# Patient Record
Sex: Female | Born: 1985 | Race: Black or African American | Hispanic: No | Marital: Single | Smoking: Never smoker
Health system: Southern US, Community
[De-identification: ages and names within clinical notes are randomized; demographics above are authoritative.]

## PROBLEM LIST (undated history)

## (undated) DIAGNOSIS — R7303 Prediabetes: Secondary | ICD-10-CM

## (undated) DIAGNOSIS — J45909 Unspecified asthma, uncomplicated: Secondary | ICD-10-CM

---

## 2010-07-24 ENCOUNTER — Emergency Department (HOSPITAL_COMMUNITY)
Admission: EM | Admit: 2010-07-24 | Discharge: 2010-07-24 | Disposition: A | Discharge status: Home / Self Care | Payer: Self-pay | Attending: Emergency Medicine | Admitting: Emergency Medicine

## 2010-07-24 DIAGNOSIS — B9689 Other specified bacterial agents as the cause of diseases classified elsewhere: Secondary | ICD-10-CM | POA: Insufficient documentation

## 2010-07-24 DIAGNOSIS — N760 Acute vaginitis: Secondary | ICD-10-CM | POA: Insufficient documentation

## 2010-07-24 DIAGNOSIS — R339 Retention of urine, unspecified: Secondary | ICD-10-CM | POA: Insufficient documentation

## 2010-07-24 DIAGNOSIS — A499 Bacterial infection, unspecified: Secondary | ICD-10-CM | POA: Insufficient documentation

## 2010-07-24 DIAGNOSIS — R109 Unspecified abdominal pain: Secondary | ICD-10-CM | POA: Insufficient documentation

## 2010-07-24 DIAGNOSIS — R42 Dizziness and giddiness: Secondary | ICD-10-CM | POA: Insufficient documentation

## 2010-07-24 LAB — URINALYSIS, ROUTINE W REFLEX MICROSCOPIC
Bilirubin Urine: NEGATIVE
Nitrite: NEGATIVE
Protein, ur: NEGATIVE mg/dL
Specific Gravity, Urine: 1.02 (ref 1.005–1.030)
Urobilinogen, UA: 0.2 mg/dL (ref 0.0–1.0)

## 2010-07-24 LAB — WET PREP, GENITAL: Yeast Wet Prep HPF POC: NONE SEEN

## 2010-07-24 LAB — PREGNANCY, URINE: Preg Test, Ur: NEGATIVE

## 2010-07-24 LAB — RPR: RPR Ser Ql: NONREACTIVE

## 2010-07-25 LAB — GC/CHLAMYDIA PROBE AMP, GENITAL: Chlamydia, DNA Probe: NEGATIVE

## 2014-04-22 ENCOUNTER — Inpatient Hospital Stay (HOSPITAL_COMMUNITY)
Admission: EM | Admit: 2014-04-22 | Discharge: 2014-04-24 | DRG: 202 | Disposition: A | Discharge status: Home / Self Care | Payer: Self-pay | Attending: Family Medicine | Admitting: Family Medicine

## 2014-04-22 ENCOUNTER — Encounter (HOSPITAL_COMMUNITY): Payer: Self-pay

## 2014-04-22 ENCOUNTER — Emergency Department (HOSPITAL_COMMUNITY): Payer: Self-pay

## 2014-04-22 DIAGNOSIS — J45902 Unspecified asthma with status asthmaticus: Principal | ICD-10-CM | POA: Diagnosis present

## 2014-04-22 DIAGNOSIS — J45909 Unspecified asthma, uncomplicated: Secondary | ICD-10-CM

## 2014-04-22 DIAGNOSIS — J45901 Unspecified asthma with (acute) exacerbation: Secondary | ICD-10-CM

## 2014-04-22 DIAGNOSIS — J9600 Acute respiratory failure, unspecified whether with hypoxia or hypercapnia: Secondary | ICD-10-CM | POA: Diagnosis present

## 2014-04-22 DIAGNOSIS — J9601 Acute respiratory failure with hypoxia: Secondary | ICD-10-CM

## 2014-04-22 DIAGNOSIS — J019 Acute sinusitis, unspecified: Secondary | ICD-10-CM

## 2014-04-22 DIAGNOSIS — D509 Iron deficiency anemia, unspecified: Secondary | ICD-10-CM

## 2014-04-22 DIAGNOSIS — R0603 Acute respiratory distress: Secondary | ICD-10-CM

## 2014-04-22 DIAGNOSIS — R000 Tachycardia, unspecified: Secondary | ICD-10-CM | POA: Diagnosis present

## 2014-04-22 HISTORY — DX: Unspecified asthma, uncomplicated: J45.909

## 2014-04-22 LAB — BLOOD GAS, ARTERIAL
ACID-BASE DEFICIT: 3.3 mmol/L — AB (ref 0.0–2.0)
Bicarbonate: 21 mEq/L (ref 20.0–24.0)
Drawn by: 234301
O2 CONTENT: 2 L/min
O2 Saturation: 93.3 %
PATIENT TEMPERATURE: 37
PCO2 ART: 36.8 mmHg (ref 35.0–45.0)
TCO2: 19.6 mmol/L (ref 0–100)
pH, Arterial: 7.376 (ref 7.350–7.450)
pO2, Arterial: 73.6 mmHg — ABNORMAL LOW (ref 80.0–100.0)

## 2014-04-22 LAB — CBC WITH DIFFERENTIAL/PLATELET
BASOS PCT: 0 % (ref 0–1)
Basophils Absolute: 0.1 10*3/uL (ref 0.0–0.1)
EOS ABS: 1.5 10*3/uL — AB (ref 0.0–0.7)
Eosinophils Relative: 10 % — ABNORMAL HIGH (ref 0–5)
HCT: 34.8 % — ABNORMAL LOW (ref 36.0–46.0)
HEMOGLOBIN: 10.5 g/dL — AB (ref 12.0–15.0)
LYMPHS ABS: 2.1 10*3/uL (ref 0.7–4.0)
LYMPHS PCT: 14 % (ref 12–46)
MCH: 22.6 pg — AB (ref 26.0–34.0)
MCHC: 30.2 g/dL (ref 30.0–36.0)
MCV: 74.8 fL — AB (ref 78.0–100.0)
MONOS PCT: 4 % (ref 3–12)
Monocytes Absolute: 0.7 10*3/uL (ref 0.1–1.0)
NEUTROS ABS: 10.9 10*3/uL — AB (ref 1.7–7.7)
Neutrophils Relative %: 72 % (ref 43–77)
PLATELETS: 352 10*3/uL (ref 150–400)
RBC: 4.65 MIL/uL (ref 3.87–5.11)
RDW: 16.3 % — ABNORMAL HIGH (ref 11.5–15.5)
WBC: 15.2 10*3/uL — ABNORMAL HIGH (ref 4.0–10.5)

## 2014-04-22 LAB — BASIC METABOLIC PANEL
Anion gap: 5 (ref 5–15)
BUN: 11 mg/dL (ref 6–23)
CALCIUM: 8.8 mg/dL (ref 8.4–10.5)
CO2: 24 mmol/L (ref 19–32)
CREATININE: 0.69 mg/dL (ref 0.50–1.10)
Chloride: 107 mmol/L (ref 96–112)
GFR calc non Af Amer: >90 mL/min (ref >90)
Glucose, Bld: 152 mg/dL — ABNORMAL HIGH (ref 70–99)
POTASSIUM: 3.9 mmol/L (ref 3.5–5.1)
Sodium: 136 mmol/L (ref 135–145)

## 2014-04-22 LAB — MRSA PCR SCREENING: MRSA by PCR: NEGATIVE

## 2014-04-22 MED ORDER — INFLUENZA VAC SPLIT QUAD 0.5 ML IM SUSY
0.5000 mL | PREFILLED_SYRINGE | INTRAMUSCULAR | Status: AC
  Administered 2014-04-23: 0.5 mL via INTRAMUSCULAR
  Filled 2014-04-22: qty 0.5

## 2014-04-22 MED ORDER — IPRATROPIUM BROMIDE 0.02 % IN SOLN: 0.5000 mg | RESPIRATORY_TRACT | Status: DC

## 2014-04-22 MED ORDER — IPRATROPIUM BROMIDE 0.02 % IN SOLN
1.0000 mg | Freq: Once | RESPIRATORY_TRACT | Status: AC
Start: 2014-04-22 — End: 2014-04-22
  Administered 2014-04-22: 1 mg via RESPIRATORY_TRACT

## 2014-04-22 MED ORDER — ALBUTEROL SULFATE (2.5 MG/3ML) 0.083% IN NEBU: 2.5000 mg | INHALATION_SOLUTION | RESPIRATORY_TRACT | Status: DC | PRN

## 2014-04-22 MED ORDER — METHYLPREDNISOLONE SODIUM SUCC 125 MG IJ SOLR
60.0000 mg | Freq: Four times a day (QID) | INTRAMUSCULAR | Status: DC
  Administered 2014-04-22 – 2014-04-24 (×8): 60 mg via INTRAVENOUS
  Filled 2014-04-22 (×8): qty 2

## 2014-04-22 MED ORDER — ACETAMINOPHEN 325 MG PO TABS
650.0000 mg | ORAL_TABLET | Freq: Four times a day (QID) | ORAL | Status: DC | PRN
  Administered 2014-04-22: 650 mg via ORAL
  Filled 2014-04-22: qty 2

## 2014-04-22 MED ORDER — ENOXAPARIN SODIUM 40 MG/0.4ML ~~LOC~~ SOLN
40.0000 mg | Freq: Every day | SUBCUTANEOUS | Status: DC
  Administered 2014-04-22 – 2014-04-24 (×3): 40 mg via SUBCUTANEOUS
  Filled 2014-04-22 (×3): qty 0.4

## 2014-04-22 MED ORDER — ALBUTEROL SULFATE (2.5 MG/3ML) 0.083% IN NEBU: 2.5000 mg | INHALATION_SOLUTION | RESPIRATORY_TRACT | Status: DC

## 2014-04-22 MED ORDER — ALBUTEROL (5 MG/ML) CONTINUOUS INHALATION SOLN
10.0000 mg/h | INHALATION_SOLUTION | Freq: Once | RESPIRATORY_TRACT | Status: AC
  Administered 2014-04-22: 10 mg/h via RESPIRATORY_TRACT
  Filled 2014-04-22: qty 20

## 2014-04-22 MED ORDER — ONDANSETRON HCL 4 MG/2ML IJ SOLN: 4.0000 mg | Freq: Four times a day (QID) | INTRAMUSCULAR | Status: DC | PRN

## 2014-04-22 MED ORDER — SODIUM CHLORIDE 0.9 % IV SOLN
INTRAVENOUS | Status: DC
  Administered 2014-04-22 – 2014-04-24 (×3): via INTRAVENOUS

## 2014-04-22 MED ORDER — ALBUTEROL (5 MG/ML) CONTINUOUS INHALATION SOLN
10.0000 mg/h | INHALATION_SOLUTION | RESPIRATORY_TRACT | Status: AC
  Administered 2014-04-22: 10 mg/h via RESPIRATORY_TRACT

## 2014-04-22 MED ORDER — ALBUTEROL (5 MG/ML) CONTINUOUS INHALATION SOLN: 10.0000 mg/h | INHALATION_SOLUTION | RESPIRATORY_TRACT | Status: AC

## 2014-04-22 MED ORDER — IPRATROPIUM BROMIDE 0.02 % IN SOLN
0.5000 mg | Freq: Once | RESPIRATORY_TRACT | Status: DC
  Filled 2014-04-22: qty 2.5

## 2014-04-22 MED ORDER — ONDANSETRON HCL 4 MG PO TABS: 4.0000 mg | ORAL_TABLET | Freq: Four times a day (QID) | ORAL | Status: DC | PRN

## 2014-04-22 MED ORDER — GUAIFENESIN ER 600 MG PO TB12
600.0000 mg | ORAL_TABLET | Freq: Two times a day (BID) | ORAL | Status: DC
  Administered 2014-04-22 – 2014-04-24 (×5): 600 mg via ORAL
  Filled 2014-04-22 (×5): qty 1

## 2014-04-22 MED ORDER — IPRATROPIUM-ALBUTEROL 0.5-2.5 (3) MG/3ML IN SOLN
3.0000 mL | RESPIRATORY_TRACT | Status: DC
  Administered 2014-04-22 – 2014-04-24 (×13): 3 mL via RESPIRATORY_TRACT
  Filled 2014-04-22 (×13): qty 3

## 2014-04-22 MED ORDER — CETYLPYRIDINIUM CHLORIDE 0.05 % MT LIQD
7.0000 mL | Freq: Two times a day (BID) | OROMUCOSAL | Status: DC
  Administered 2014-04-22 – 2014-04-23 (×3): 7 mL via OROMUCOSAL

## 2014-04-22 MED ORDER — MAGNESIUM SULFATE 2 GM/50ML IV SOLN
2.0000 g | INTRAVENOUS | Status: AC
  Administered 2014-04-22: 2 g via INTRAVENOUS
  Filled 2014-04-22: qty 50

## 2014-04-22 MED ORDER — ACETAMINOPHEN 650 MG RE SUPP
650.0000 mg | Freq: Four times a day (QID) | RECTAL | Status: DC | PRN
Start: 2014-04-22 — End: 2014-04-24

## 2014-04-22 MED ORDER — PREDNISONE 20 MG PO TABS
40.0000 mg | ORAL_TABLET | Freq: Once | ORAL | Status: AC
  Administered 2014-04-22: 40 mg via ORAL
  Filled 2014-04-22: qty 2

## 2014-04-22 NOTE — ED Provider Notes (Signed)
CSN: [id number]     Arrival date & time 04/22/14  1191 History  This chart was scribed for Vida Roller, MD by Ronney Lion, ED Scribe. This patient was seen in room APA18/APA18 and the patient's care was started at 8:08 AM.    Chief Complaint  Patient presents with  . Shortness of Breath   LEVEL 5 CAVEAT DUE TO ACUITY OF CONDITION  The history is provided by the patient. No language interpreter was used.     HPI Comments: Lorraine White is a 29 y.o. female with a history of asthma who presents to the Emergency Department complaining of worsening SOB that began a few days ago, and acutely became severe at 3 AM when Lorraine White was sleeping, about 5 hours ago. Lorraine White states Lorraine White took a home nebulizer treatment last night, which provided no relief, and was followed by a migraine and fever. Lorraine White endorses "sinus trouble" before last night.  The symptoms are severe and persistent, Lorraine White is unable to talk in more than 2 word sentences due to severe respiratory distress  Past Medical History  Diagnosis Date  . Asthma    History reviewed. No pertinent past surgical history. No family history on file. History  Substance Use Topics  . Smoking status: Never Smoker   . Smokeless tobacco: Not on file  . Alcohol Use: No   OB History    No data available     Review of Systems  Unable to perform ROS: Acuity of condition  Constitutional: Positive for fever.  HENT: Positive for congestion and sinus pressure.   Respiratory: Positive for shortness of breath.   Neurological: Positive for headaches.      Allergies  Review of patient's allergies indicates no known allergies.  Home Medications   Prior to Admission medications   Medication Sig Start Date End Date Taking? Authorizing Provider  acetaminophen (TYLENOL) 650 MG CR tablet Take 650 mg by mouth every 8 (eight) hours as needed for fever.   Yes Historical Provider, MD  albuterol (PROVENTIL HFA;VENTOLIN HFA) 108 (90 BASE) MCG/ACT inhaler Inhale 2 puffs  into the lungs every 6 (six) hours as needed for wheezing or shortness of breath.   Yes Historical Provider, MD  ipratropium-albuterol (DUONEB) 0.5-2.5 (3) MG/3ML SOLN Take 3 mLs by nebulization every 6 (six) hours as needed (shortness of breath and wheezing.).   Yes Historical Provider, MD  magnesium citrate SOLN Take 1 Bottle by mouth daily as needed for mild constipation or severe constipation.   Yes Historical Provider, MD   BP 141/103 mmHg  Pulse 130  Temp(Src) 97.7 F (36.5 C) (Oral)  Resp 30  Ht  (1.626 m)  Wt 210 lb (95.255 kg)  BMI 36.03 kg/m2  SpO2 100%  LMP 03/22/2014 Physical Exam  Constitutional: Lorraine White appears well-developed and well-nourished. Lorraine White appears distressed.  HENT:  Head: Normocephalic and atraumatic.  Mouth/Throat: Posterior oropharyngeal erythema present. No oropharyngeal exudate.  Mild pharyngeal erythema.  Eyes: Conjunctivae and EOM are normal. Pupils are equal, round, and reactive to light. Right eye exhibits no discharge. Left eye exhibits no discharge. No scleral icterus.  Neck: Normal range of motion. Neck supple. No JVD present. No thyromegaly present.  Cardiovascular: Regular rhythm, normal heart sounds and intact distal pulses.  Exam reveals no gallop and no friction rub.   No murmur heard. Tachycardic to 130.  Pulmonary/Chest: Accessory muscle usage present. Lorraine White is in respiratory distress. Lorraine White has decreased breath sounds. Lorraine White has wheezes. Lorraine White has no rales.  Severe  respiratory distress. Speaks in two words sentences.  Diffuse expiratory wheezing and decreased breath sounds bilaterally with a prolonged expiratory phase, and accessory muscle use.   Abdominal: Soft. Bowel sounds are normal. Lorraine White exhibits no distension and no mass. There is no tenderness.  Musculoskeletal: Normal range of motion. Lorraine White exhibits no edema or tenderness.  Lymphadenopathy:    Lorraine White has no cervical adenopathy.  Neurological: Lorraine White is alert. Coordination normal.  Skin: Skin is  warm and dry. No rash noted. No erythema.  Psychiatric: Lorraine White has a normal mood and affect. Her behavior is normal.  Nursing note and vitals reviewed.   ED Course  Procedures (including critical care time)  DIAGNOSTIC STUDIES: Oxygen Saturation is 95% on room air, adequate by my interpretation.    COORDINATION OF CARE: 8:08 AM - Discussed treatment plan with pt at bedside which includes CXR, and monitoring, and pt agreed to plan.   Labs Review Labs Reviewed  CBC WITH DIFFERENTIAL/PLATELET - Abnormal; Notable for the following:    WBC 15.2 (*)    Hemoglobin 10.5 (*)    HCT 34.8 (*)    MCV 74.8 (*)    MCH 22.6 (*)    RDW 16.3 (*)    Neutro Abs 10.9 (*)    Eosinophils Relative 10 (*)    Eosinophils Absolute 1.5 (*)    All other components within normal limits  BASIC METABOLIC PANEL - Abnormal; Notable for the following:    Glucose, Bld 152 (*)    All other components within normal limits  BLOOD GAS, ARTERIAL    Imaging Review Dg Chest Port 1 View  04/22/2014   CLINICAL DATA:  Shortness of breath for a few days which became acutely worse at 3:00 a.m. 04/22/2014  EXAM: PORTABLE CHEST - 1 VIEW  COMPARISON:  None.  FINDINGS: Heart size and mediastinal contours are within normal limits. Both lungs are clear. Visualized skeletal structures are unremarkable.  IMPRESSION: Negative exam.   Electronically Signed   By: Drusilla Kanner M.D.   On: 04/22/2014 08:41     MDM   Final diagnoses:  Asthma  Respiratory distress  Severe asthma   The patient presents with acute respiratory distress with persistent wheezing and tachycardia. Lorraine White is hardly moving any oxygen or air at all. Lorraine White has used home nebulizer prior to arrival with minimal improvement. The patient will need a continuous nebulizer treatment with prednisone, repeat evaluation and evaluation to rule out pneumothorax pneumonia or other invasive abnormalities.  9:30 AM, reevaluated after continuous nebulizer and continues to have  persistent respiratory distress with severe wheezing. Speaking in 2 word sentences persistently. Chest x-ray reviewed without pneumothorax or pneumonia, labs show leukocytosis of 15,000, will need to be admitted for persistent respiratory distress.  Will d/w hospitalist - 2nd continuous neb being given now Magnesium IV given for severe respiratory distress.  D/w Dr. Kerry Hough who will admit to step down  Meds given in ED:  Medications  albuterol (PROVENTIL,VENTOLIN) solution continuous neb (10 mg/hr Nebulization Not Given 04/22/14 0813)  ipratropium (ATROVENT) nebulizer solution 0.5 mg (0.5 mg Nebulization Not Given 04/22/14 0814)  albuterol (PROVENTIL,VENTOLIN) solution continuous neb (not administered)  magnesium sulfate IVPB 2 g 50 mL (2 g Intravenous New Bag/Given 04/22/14 0943)  albuterol (PROVENTIL,VENTOLIN) solution continuous neb (10 mg/hr Nebulization Given 04/22/14 0814)  ipratropium (ATROVENT) nebulizer solution 1 mg (1 mg Nebulization Given 04/22/14 0814)  predniSONE (DELTASONE) tablet 40 mg (40 mg Oral Given 04/22/14 0850)    New Prescriptions   No  medications on file   CRITICAL CARE Performed by: Vida RollerMILLER,Demiya Magno D Total critical care time: 35 Critical care time was exclusive of separately billable procedures and treating other patients. Critical care was necessary to treat or prevent imminent or life-threatening deterioration. Critical care was time spent personally by me on the following activities: development of treatment plan with patient and/or surrogate as well as nursing, discussions with consultants, evaluation of patient's response to treatment, examination of patient, obtaining history from patient or surrogate, ordering and performing treatments and interventions, ordering and review of laboratory studies, ordering and review of radiographic studies, pulse oximetry and re-evaluation of patient's condition.  I personally performed the services described in this documentation, which  was scribed in my presence. The recorded information has been reviewed and is accurate.     Vida RollerBrian D Reynolds Kittel, MD 04/22/14 [phone]

## 2014-04-22 NOTE — ED Notes (Signed)
Patient with respiratory distress. H/o asthma. Wheezing noted in all lung fields. Unable to speak in full sentences. Reports taking neb last night at home, but unable to take this morning due to malfunction in machine. Patient reports using inhaler just prior to arrival without improvement.

## 2014-04-22 NOTE — H&P (Signed)
[facility] History and Physical  Lorraine StanfordBrittny Campos UJW:[id number] DOB: 08/24/1985 DOA: 04/22/2014  Referring physician: Dr. Hyacinth MeekerMiller, ER PCP: Pcp Not In System   Chief Complaint: Shortness of breath  HPI: Lorraine White is a 29 y.o. female with a history of chronic asthma, presents to the emergency room with complaints of worsening shortness of breath that began several days prior to admission. She reports that around 3 AM, she woke up with acutely worsening shortness of breath. He took a home nebulizer treatment without significant relief in her symptoms. She felt feverish at that time. She does report having sinus congestion now for last few days. She denies any sore throat, myalgias. She has a dry, nonproductive cough. She is at increased wheezing. She presents to the ER with severe and persistent shortness of breath, and was unable to speak in more than 2 word sentences. She is being admitted to the hospital for asthma exacerbation.   Review of Systems:  Pertinent positives as per HPI, otherwise negative  Past Medical History  Diagnosis Date  . Asthma    History reviewed. No pertinent past surgical history. Social History:  reports that she has never smoked. She does not have any smokeless tobacco history on file. She reports that she does not drink alcohol or use illicit drugs.  No Known Allergies  Family history: mother has asthma  Prior to Admission medications   Medication Sig Start Date End Date Taking? Authorizing Provider  acetaminophen (TYLENOL) 650 MG CR tablet Take 650 mg by mouth every 8 (eight) hours as needed for fever.   Yes Historical Provider, MD  albuterol (PROVENTIL HFA;VENTOLIN HFA) 108 (90 BASE) MCG/ACT inhaler Inhale 2 puffs into the lungs every 6 (six) hours as needed for wheezing or shortness of breath.   Yes Historical Provider, MD  ipratropium-albuterol (DUONEB) 0.5-2.5 (3) MG/3ML SOLN Take 3 mLs by nebulization every 6 (six) hours as needed (shortness of  breath and wheezing.).   Yes Historical Provider, MD  magnesium citrate SOLN Take 1 Bottle by mouth daily as needed for mild constipation or severe constipation.   Yes Historical Provider, MD   Physical Exam: Filed Vitals:   04/22/14 0900 04/22/14 1001 04/22/14 1030 04/22/14 1045  BP: 141/103  113/76   Pulse: 130  124 123  Temp:      TempSrc:      Resp:      Height:      Weight:      SpO2: 100% 98% 97% 97%    Wt Readings from Last 3 Encounters:  04/22/14 95.255 kg (210 lb)    General:  Sitting up in bed, increased work of breathing Eyes: PERRL, normal lids, irises & conjunctiva ENT: grossly normal hearing, lips & tongue Neck: no LAD, masses or thyromegaly Cardiovascular: s1, s2, tachycardic, no m/r/g. No LE edema. Telemetry: sinus tachycardia  Respiratory: bilateral wheezing with increased resp effort Abdomen: soft, ntnd Skin: no rash or induration seen on limited exam Musculoskeletal: grossly normal tone BUE/BLE Psychiatric: grossly normal mood and affect, speech fluent and appropriate Neurologic: grossly non-focal.          Labs on Admission:  Basic Metabolic Panel:  Recent Labs Lab 04/22/14 0847  NA 136  K 3.9  CL 107  CO2 24  GLUCOSE 152*  BUN 11  CREATININE 0.69  CALCIUM 8.8   Liver Function Tests: No results for input(s): AST, ALT, ALKPHOS, BILITOT, PROT, ALBUMIN in the last 168 hours. No results for input(s): LIPASE, AMYLASE in the last  168 hours. No results for input(s): AMMONIA in the last 168 hours. CBC:  Recent Labs Lab 04/22/14 0847  WBC 15.2*  NEUTROABS 10.9*  HGB 10.5*  HCT 34.8*  MCV 74.8*  PLT 352   Cardiac Enzymes: No results for input(s): CKTOTAL, CKMB, CKMBINDEX, TROPONINI in the last 168 hours.  BNP (last 3 results) No results for input(s): PROBNP in the last 8760 hours. CBG: No results for input(s): GLUCAP in the last 168 hours.  Radiological Exams on Admission: Dg Chest Port 1 View  04/22/2014   CLINICAL DATA:  Shortness  of breath for a few days which became acutely worse at 3:00 a.m. 04/22/2014  EXAM: PORTABLE CHEST - 1 VIEW  COMPARISON:  None.  FINDINGS: Heart size and mediastinal contours are within normal limits. Both lungs are clear. Visualized skeletal structures are unremarkable.  IMPRESSION: Negative exam.   Electronically Signed   By: Drusilla Kanner M.D.   On: 04/22/2014 08:41    Assessment/Plan Principal Problem:   Status asthmaticus Active Problems:   Acute respiratory failure   Tachycardia   1. Status asthmaticus. Patient will be admitted to the stepdown unit. ABG is reassuring. We'll continue with bronchodilators and intravenous steroids. Patient received magnesium in the emergency room. We'll continue to monitor. 2. Acute respiratory failure. Related to #1. Wean off oxygen as tolerated. 3. Tachycardia. Reactive to underlying pulmonary issues.    Code Status: full code DVT Prophylaxis: lovenox Family Communication: discussed with patient Disposition Plan: discharge home once improved  Time spent:  [facility] Pager [phone]

## 2014-04-22 NOTE — ED Notes (Signed)
RT at bedside.

## 2014-04-22 NOTE — ED Notes (Addendum)
Report given to Michael, RN.

## 2014-04-22 NOTE — ED Notes (Signed)
Complain of difficulty breathing. States is worse today

## 2014-04-23 LAB — BASIC METABOLIC PANEL
Anion gap: 8 (ref 5–15)
BUN: 10 mg/dL (ref 6–23)
CO2: 19 mmol/L (ref 19–32)
CREATININE: 0.63 mg/dL (ref 0.50–1.10)
Calcium: 9.1 mg/dL (ref 8.4–10.5)
Chloride: 111 mmol/L (ref 96–112)
GFR calc Af Amer: >90 mL/min (ref >90)
GFR calc non Af Amer: >90 mL/min (ref >90)
Glucose, Bld: 169 mg/dL — ABNORMAL HIGH (ref 70–99)
Potassium: 4.1 mmol/L (ref 3.5–5.1)
Sodium: 138 mmol/L (ref 135–145)

## 2014-04-23 LAB — CBC
HCT: 31.9 % — ABNORMAL LOW (ref 36.0–46.0)
Hemoglobin: 9.5 g/dL — ABNORMAL LOW (ref 12.0–15.0)
MCH: 22.1 pg — AB (ref 26.0–34.0)
MCHC: 29.8 g/dL — ABNORMAL LOW (ref 30.0–36.0)
MCV: 74.4 fL — AB (ref 78.0–100.0)
Platelets: 373 10*3/uL (ref 150–400)
RBC: 4.29 MIL/uL (ref 3.87–5.11)
RDW: 16.5 % — ABNORMAL HIGH (ref 11.5–15.5)
WBC: 17.9 10*3/uL — ABNORMAL HIGH (ref 4.0–10.5)

## 2014-04-23 MED ORDER — MONTELUKAST SODIUM 10 MG PO TABS
10.0000 mg | ORAL_TABLET | Freq: Every day | ORAL | Status: DC
  Administered 2014-04-23: 10 mg via ORAL
  Filled 2014-04-23: qty 1

## 2014-04-23 MED ORDER — POLYVINYL ALCOHOL 1.4 % OP SOLN
1.0000 [drp] | OPHTHALMIC | Status: DC | PRN
  Administered 2014-04-23 – 2014-04-24 (×2): 1 [drp] via OPHTHALMIC
  Filled 2014-04-23: qty 15

## 2014-04-23 MED ORDER — MOMETASONE FURO-FORMOTEROL FUM 100-5 MCG/ACT IN AERO
2.0000 | INHALATION_SPRAY | Freq: Two times a day (BID) | RESPIRATORY_TRACT | Status: DC
  Filled 2014-04-23: qty 8.8

## 2014-04-23 NOTE — Progress Notes (Signed)
Report given to 300 Dept. And patient transported via wheelchair to room.

## 2014-04-23 NOTE — Care Management Note (Addendum)
    Page 1 of 1   04/24/2014     2:27:23 PM CARE MANAGEMENT NOTE 04/24/2014  Patient:  Lorraine White,Lorraine White   Account Number:  [id number]  Date Initiated:  04/23/2014  Documentation initiated by:  Kathyrn SheriffHILDRESS,JESSICA  Subjective/Objective Assessment:   Pt is from home, admitted with status asthmaticus. [facility] and they assist pt with medications. Pt plans to discharge home with self care. No CM needs identified at this time.     Action/Plan:   Anticipated DC Date:  04/23/2014   Anticipated DC Plan:  HOME/SELF CARE      DC Planning Services  CM consult      Choice offered to / List presented to:             Status of service:  Completed, signed off Medicare Important Message given?   (If response is "NO", the following Medicare IM given date fields will be blank) Date Medicare IM given:   Medicare IM given by:   Date Additional Medicare IM given:   Additional Medicare IM given by:    Discharge Disposition:  HOME/SELF CARE  Per UR Regulation:    If discussed at Long Length of Stay Meetings, dates discussed:    Comments:  04/24/2014 1400 Kathyrn SheriffJessica Childress, RN, MSN, CM Pt plans to discharge home with self care possibly today. Follow up appointment made with [facility]. Pt says she will not need med assistance. If any of the medications are not on the $4 list she can fill them through the [facility] clinic with cost assistance. Referral made to the financial counselor. Pt told that fin. counselor would contact her by phone if she was not able to see her in the hospital. No further CM needs identified at this time. 04/23/2014 1300 Kathyrn SheriffJessica Childress, RN, MSN, CM

## 2014-04-23 NOTE — Care Management Utilization Note (Signed)
UR completed 

## 2014-04-23 NOTE — Progress Notes (Signed)
[facility] PROGRESS NOTE  Wynonia Neyhart WUJ:[id number] DOB: Jan 02, 1986 DOA: 04/22/2014 PCP: Pcp Not In System  Assessment/Plan: 1. Acute respiratory failure. Related to asthma exacerbation. Appears to be clinically improving. Continue current treatments. 2. Status asthmaticus. Improved with bronchodilators and intravenous steroids. The patient is improved since admission. Upon ambulation today, she became very short of breath, tachycardic and needed a wheelchair to get back to her room. We will need to continue with current treatments for now. Start her on Singulair. She reports being on Advair in the past with good results. Since she is using her rescue inhaler/albuterol on a daily basis, we will resume an inhaled steroid/long-acting beta agonist. She will need transition to prednisone, this can likely be done tomorrow. Will continue current treatments for now 3. Tachycardia. Likely reactive  Code Status: full code Family Communication: discussed with patient Disposition Plan: discharge home once improved   Consultants:    Procedures:    Antibiotics:    HPI/Subjective: Feeling better today. Still mildly short of breath, but definitely improved since admission  Objective: Filed Vitals:   04/23/14 1600  BP:   Pulse:   Temp:   Resp: 24    Intake/Output Summary (Last 24 hours) at 04/23/14 1756 Last data filed at 04/23/14 1600  Gross per 24 hour  Intake   2370 ml  Output      0 ml  Net   2370 ml   Filed Weights   04/22/14 0755 04/23/14 0500  Weight: 95.255 kg (210 lb) 92.6 kg (204 lb 2.3 oz)    Exam:   General:  NAD  Cardiovascular: S1, S2 tachycardic  Respiratory: clear bilaterally with minimal wheezing  Abdomen: soft, nt, nd, bs  MSK: no edema b/l   Data Reviewed: Basic Metabolic Panel:  Recent Labs Lab 04/22/14 0847 04/23/14 0505  NA 136 138  K 3.9 4.1  CL 107 111  CO2 24 19  GLUCOSE 152* 169*  BUN 11 10  CREATININE 0.69 0.63  CALCIUM  8.8 9.1   Liver Function Tests: No results for input(s): AST, ALT, ALKPHOS, BILITOT, PROT, ALBUMIN in the last 168 hours. No results for input(s): LIPASE, AMYLASE in the last 168 hours. No results for input(s): AMMONIA in the last 168 hours. CBC:  Recent Labs Lab 04/22/14 0847 04/23/14 0505  WBC 15.2* 17.9*  NEUTROABS 10.9*  --   HGB 10.5* 9.5*  HCT 34.8* 31.9*  MCV 74.8* 74.4*  PLT 352 373   Cardiac Enzymes: No results for input(s): CKTOTAL, CKMB, CKMBINDEX, TROPONINI in the last 168 hours. BNP (last 3 results) No results for input(s): BNP in the last 8760 hours.  ProBNP (last 3 results) No results for input(s): PROBNP in the last 8760 hours.  CBG: No results for input(s): GLUCAP in the last 168 hours.  Recent Results (from the past 240 hour(s))  MRSA PCR Screening     Status: None   Collection Time: 04/22/14 11:30 AM  Result Value Ref Range Status   MRSA by PCR NEGATIVE NEGATIVE Final    Comment:        The GeneXpert MRSA Assay (FDA approved for NASAL specimens only), is one component of a comprehensive MRSA colonization surveillance program. It is not intended to diagnose MRSA infection nor to guide or monitor treatment for MRSA infections.      Studies: Dg Chest Port 1 View  04/22/2014   CLINICAL DATA:  Shortness of breath for a few days which became acutely worse at 3:00 a.m. 04/22/2014  EXAM: PORTABLE CHEST - 1 VIEW  COMPARISON:  None.  FINDINGS: Heart size and mediastinal contours are within normal limits. Both lungs are clear. Visualized skeletal structures are unremarkable.  IMPRESSION: Negative exam.   Electronically Signed   By: Drusilla Kannerhomas  Dalessio M.D.   On: 04/22/2014 08:41    Scheduled Meds: . antiseptic oral rinse  7 mL Mouth Rinse BID  . enoxaparin (LOVENOX) injection  40 mg Subcutaneous Daily  . guaiFENesin  600 mg Oral BID  . ipratropium-albuterol  3 mL Nebulization Q4H  . methylPREDNISolone (SOLU-MEDROL) injection  60 mg Intravenous Q6H    Continuous Infusions: . sodium chloride 75 mL/hr at 04/23/14 1400    Principal Problem:   Status asthmaticus Active Problems:   Acute respiratory failure   Tachycardia    Time spent: 30mins    MEMON,JEHANZEB  [facility] Pager [phone]. If 7PM-7AM, please contact night-coverage at [email or web address], password [email or web address] 04/23/2014, 5:56 PM  LOS: 1 day

## 2014-04-24 DIAGNOSIS — J9601 Acute respiratory failure with hypoxia: Secondary | ICD-10-CM

## 2014-04-24 DIAGNOSIS — J0100 Acute maxillary sinusitis, unspecified: Secondary | ICD-10-CM

## 2014-04-24 DIAGNOSIS — J45901 Unspecified asthma with (acute) exacerbation: Secondary | ICD-10-CM

## 2014-04-24 DIAGNOSIS — J019 Acute sinusitis, unspecified: Secondary | ICD-10-CM

## 2014-04-24 DIAGNOSIS — D509 Iron deficiency anemia, unspecified: Secondary | ICD-10-CM

## 2014-04-24 LAB — BASIC METABOLIC PANEL
ANION GAP: 10 (ref 5–15)
BUN: 16 mg/dL (ref 6–23)
CO2: 21 mmol/L (ref 19–32)
CREATININE: 0.68 mg/dL (ref 0.50–1.10)
Calcium: 8.8 mg/dL (ref 8.4–10.5)
Chloride: 106 mmol/L (ref 96–112)
GFR calc Af Amer: >90 mL/min (ref >90)
GFR calc non Af Amer: >90 mL/min (ref >90)
Glucose, Bld: 164 mg/dL — ABNORMAL HIGH (ref 70–99)
Potassium: 4.1 mmol/L (ref 3.5–5.1)
Sodium: 137 mmol/L (ref 135–145)

## 2014-04-24 LAB — CBC
HCT: 30.1 % — ABNORMAL LOW (ref 36.0–46.0)
Hemoglobin: 9.1 g/dL — ABNORMAL LOW (ref 12.0–15.0)
MCH: 22.4 pg — AB (ref 26.0–34.0)
MCHC: 30.2 g/dL (ref 30.0–36.0)
MCV: 74 fL — ABNORMAL LOW (ref 78.0–100.0)
Platelets: 381 10*3/uL (ref 150–400)
RBC: 4.07 MIL/uL (ref 3.87–5.11)
RDW: 17.1 % — AB (ref 11.5–15.5)
WBC: 22.9 10*3/uL — AB (ref 4.0–10.5)

## 2014-04-24 LAB — PREGNANCY, URINE: PREG TEST UR: NEGATIVE

## 2014-04-24 MED ORDER — FLUTICASONE PROPIONATE HFA 44 MCG/ACT IN AERO: 2.0000 | INHALATION_SPRAY | Freq: Two times a day (BID) | RESPIRATORY_TRACT | Status: DC

## 2014-04-24 MED ORDER — FLUTICASONE PROPIONATE HFA 44 MCG/ACT IN AERO
2.0000 | INHALATION_SPRAY | Freq: Two times a day (BID) | RESPIRATORY_TRACT | Status: DC
  Filled 2014-04-24: qty 10.6

## 2014-04-24 MED ORDER — PREDNISONE 20 MG PO TABS: 40.0000 mg | ORAL_TABLET | Freq: Every day | ORAL | Status: DC

## 2014-04-24 MED ORDER — AMOXICILLIN-POT CLAVULANATE 875-125 MG PO TABS
1.0000 | ORAL_TABLET | Freq: Two times a day (BID) | ORAL | Status: DC
  Administered 2014-04-24: 1 via ORAL
  Filled 2014-04-24: qty 1

## 2014-04-24 MED ORDER — ALBUTEROL SULFATE (2.5 MG/3ML) 0.083% IN NEBU: 2.5000 mg | INHALATION_SOLUTION | RESPIRATORY_TRACT | Status: AC | PRN

## 2014-04-24 MED ORDER — AMOXICILLIN-POT CLAVULANATE 875-125 MG PO TABS: 1.0000 | ORAL_TABLET | Freq: Two times a day (BID) | ORAL | Status: DC

## 2014-04-24 MED ORDER — PREDNISONE 10 MG PO TABS: ORAL_TABLET | ORAL | Status: DC

## 2014-04-24 NOTE — Discharge Summary (Signed)
Physician Discharge Summary  Lorraine White WUJ:[id number] DOB: 1986-01-25 DOA: 04/22/2014  PCP: Pcp Not In System Lorraine White for PCP care Lorraine White [phone]  Admit date: 04/22/2014 Discharge date: 04/24/2014  Recommendations for Outpatient Follow-up:  1. Ongoing treatment for asthma, suspect moderate persistent. Inhaled steroid added.  2. Resolution of acute sinusitis 3. Microcytic anemia, chronic by history. Consider outpatient evaluation as clinically indicated.   Follow-up Information    Follow up with [facility] On 05/07/2014.   Why:  at 3:20 pm   Contact information:   [phone]     Discharge Diagnoses:  1. Acute hypoxic respiratory failure 2. Status asthmaticus 3. Asthma exacerbation 4. Acute sinusitis 5. Microcytic anemia  Discharge Condition: improved Disposition: home  Diet recommendation: regular  Filed Weights   04/22/14 0755 04/23/14 0500  Weight: 95.255 kg (210 lb) 92.6 kg (204 lb 2.3 oz)    History of present illness:  29 year old woman with history of chronic asthma presented with severe respiratory distress. Admitted for status asthmaticus. Treated with steroids, bronchodilators, intravenous magnesium.  Hospital Course:  Ms. Lorraine White was treated aggressively with steroids, oxygen supplementation, bronchodilators. Her symptoms gradually improved and are very close to baseline at this point. Hypoxic respiratory failure resolved. She has had symptoms for approximately 10 days and more of acute sinusitis which may have precipitated this. She does however give a history of daily rescue inhaler use over the last month suggesting moderate persistent asthma. Therefore an inhaled steroid was added to her regimen. Other issues as below.  1. Acute hypoxic respiratory failure secondary to asthma exacerbation. 2. Status asthmaticus. Resolved 3. Asthma exacerbation, history suggests moderate persistent asthma--added inhaled steroid. 4. Acute  sinusitis with 10 days of symptoms, may have precipitated acute exacerbation. 5. Microcytic anemia, chronic per patient, usually around 9. Mother with h/o of anemia as well.   Plan to treat empirically for sinusitis  Rx for home nebulizer albuterol (has machine), steroid taper, inhaled steroids  Fax information to PCP  Consultants:  none  Procedures:  none  Antibiotics: Augmentin x7 days  Discharge Instructions  Discharge Instructions    Activity as tolerated - No restrictions    Complete by:  As directed      Diet general    Complete by:  As directed      Discharge instructions    Complete by:  As directed   Call your physician or seek immediate medical attention for wheezing, shortness of breath or worsening of condition.          Current Discharge Medication List    START taking these medications   Details  albuterol (PROVENTIL) (2.5 MG/3ML) 0.083% nebulizer solution Take 3 mLs (2.5 mg total) by nebulization every 4 (four) hours as needed for wheezing. Qty: 75 mL, Refills: 0    amoxicillin-clavulanate (AUGMENTIN) 875-125 MG per tablet Take 1 tablet by mouth every 12 (twelve) hours. Qty: 13 tablet, Refills: 0    fluticasone (FLOVENT HFA) 44 MCG/ACT inhaler Inhale 2 puffs into the lungs 2 (two) times daily. Qty: 1 Inhaler, Refills: 0    predniSONE (DELTASONE) 10 MG tablet Take 40 mg by mouth daily for 3 days, then take 20 mg by mouth daily for 3 days, then take 10 mg by mouth daily for 3 days, then stop. Qty: 21 tablet, Refills: 0      CONTINUE these medications which have NOT CHANGED   Details  acetaminophen (TYLENOL) 650 MG CR tablet Take 650 mg by mouth every 8 (eight) hours as  needed for fever.    albuterol (PROVENTIL HFA;VENTOLIN HFA) 108 (90 BASE) MCG/ACT inhaler Inhale 2 puffs into the lungs every 6 (six) hours as needed for wheezing or shortness of breath.    magnesium citrate SOLN Take 1 Bottle by mouth daily as needed for mild constipation or  severe constipation.      STOP taking these medications     ipratropium-albuterol (DUONEB) 0.5-2.5 (3) MG/3ML SOLN        No Known Allergies  The results of significant diagnostics from this hospitalization (including imaging, microbiology, ancillary and laboratory) are listed below for reference.    Significant Diagnostic Studies: Dg Chest Port 1 View  04/22/2014   CLINICAL DATA:  Shortness of breath for a few days which became acutely worse at 3:00 a.m. 04/22/2014  EXAM: PORTABLE CHEST - 1 VIEW  COMPARISON:  None.  FINDINGS: Heart size and mediastinal contours are within normal limits. Both lungs are clear. Visualized skeletal structures are unremarkable.  IMPRESSION: Negative exam.   Electronically Signed   By: Lorraine Kannerhomas  White M.D.   On: 04/22/2014 08:41    Microbiology: Recent Results (from the past 240 hour(s))  MRSA PCR Screening     Status: None   Collection Time: 04/22/14 11:30 AM  Result Value Ref Range Status   MRSA by PCR NEGATIVE NEGATIVE Final    Comment:        The GeneXpert MRSA Assay (FDA approved for NASAL specimens only), is one component of a comprehensive MRSA colonization surveillance program. It is not intended to diagnose MRSA infection nor to guide or monitor treatment for MRSA infections.      Labs: Basic Metabolic Panel:  Recent Labs Lab 04/22/14 0847 04/23/14 0505 04/24/14 0558  NA 136 138 137  K 3.9 4.1 4.1  CL 107 111 106  CO2 24 19 21   GLUCOSE 152* 169* 164*  BUN 11 10 16   CREATININE 0.69 0.63 0.68  CALCIUM 8.8 9.1 8.8   CBC:  Recent Labs Lab 04/22/14 0847 04/23/14 0505 04/24/14 0558  WBC 15.2* 17.9* 22.9*  NEUTROABS 10.9*  --   --   HGB 10.5* 9.5* 9.1*  HCT 34.8* 31.9* 30.1*  MCV 74.8* 74.4* 74.0*  PLT 352 373 381    Principal Problem:   Status asthmaticus Active Problems:   Tachycardia   Acute respiratory failure with hypoxia   Asthma exacerbation   Acute sinusitis   Microcytic anemia   Time coordinating  discharge: 35 minutes  Signed:  Brendia Sacksaniel Shakeda Pearse, MD [facility] 04/24/2014, 3:26 PM

## 2014-04-24 NOTE — Progress Notes (Addendum)
  PROGRESS NOTE  Lavena StanfordBrittny Kuenzi LKG:[id number] DOB: 06/27/1985 DOA: 04/22/2014 PCP: [facility] In [facility] Mariam Dollarlizabeth Pickral [phone]  Summary: 29 year old woman with history of chronic asthma presented with severe respiratory distress. Admitted for status asthmaticus. Treated with steroids, bronchodilators, intravenous magnesium.  Assessment/Plan: 1. Acute hypoxic respiratory failure secondary to asthma exacerbation. 2. Status asthmaticus. Resolved 3. Asthma exacerbation, history suggests moderate persistent asthma--either medium dose steroid or low-dose + LABA 4. Acute sinusitis with 10 days of symptoms 5. Microcytic anemia, chronic per patient, usually around 9. Mother with h/o of anemia as well.   Much better, plan on discharge later today  Plan to treat empirically for sinusitis  Rx for home nebulizer albuterol (has machine), steroid taper, inhaled steroids  Fax information to PCP  Consider further evaluation of anemia as an outpatient.  Brendia Sacksaniel Goodrich, MD  [facility]  Pager [phone] If 7PM-7AM, please contact night-coverage at [email or web address], password [email or web address] 04/24/2014, 12:06 PM  LOS: 2 days   Consultants:    Procedures:    Antibiotics:    HPI/Subjective: Feels much better, eating well, no n/v. Ambulating well. Breathing better. Mother at bedside feels like she is doing very well. Both want her to go home. Some sinus pressure frontal and maxillary, but improving.  Objective: Filed Vitals:   04/24/14 0321 04/24/14 0618 04/24/14 0710 04/24/14 1118  BP:  117/66    Pulse:  113    Temp:  98.1 F (36.7 C)    TempSrc:  Oral    Resp:  20    Height:      Weight:      SpO2: 96% 97% 96% 94%    Intake/Output Summary (Last 24 hours) at 04/24/14 1206 Last data filed at 04/24/14 0953  Gross per 24 hour  Intake   1630 ml  Output      0 ml  Net   1630 ml     Filed Weights   04/22/14 0755 04/23/14 0500  Weight: 95.255 kg (210 lb) 92.6  kg (204 lb 2.3 oz)    Exam:     Afebrile, vital signs stable. No hypoxia. Heart rate 113. General:  Appears calm and comfortable ENT: some pain with palpation of maxillary sinuses Cardiovascular: RRR, no m/r/g. No LE edema. Respiratory: CTA bilaterally, no w/r/r. Normal respiratory effort. Speaks in full sentences.  Psychiatric: grossly normal mood and affect, speech fluent and appropriate  Data Reviewed:  Basic metabolic panel unremarkable  WBC 22.9 (on steroids)  Hemoglobin without significant change, 9.1  Pertinent data:  Labs  Hemoglobin 10.5 >> 9.5 >> 9.1. MCV 74. Imaging   CXR NAD  Scheduled Meds: . enoxaparin (LOVENOX) injection  40 mg Subcutaneous Daily  . guaiFENesin  600 mg Oral BID  . ipratropium-albuterol  3 mL Nebulization Q4H  . methylPREDNISolone (SOLU-MEDROL) injection  60 mg Intravenous Q6H  . mometasone-formoterol  2 puff Inhalation BID  . montelukast  10 mg Oral QHS   Continuous Infusions: . sodium chloride 75 mL/hr at 04/24/14 1101    Principal Problem:   Status asthmaticus Active Problems:   Tachycardia   Acute respiratory failure with hypoxia   Asthma exacerbation   Acute sinusitis   Microcytic anemia

## 2016-10-14 ENCOUNTER — Emergency Department (HOSPITAL_COMMUNITY): Payer: Self-pay

## 2016-10-14 ENCOUNTER — Encounter (HOSPITAL_COMMUNITY): Payer: Self-pay | Admitting: Emergency Medicine

## 2016-10-14 ENCOUNTER — Emergency Department (HOSPITAL_COMMUNITY)
Admission: EM | Admit: 2016-10-14 | Discharge: 2016-10-14 | Disposition: A | Discharge status: Home / Self Care | Payer: Self-pay | Attending: Emergency Medicine | Admitting: Emergency Medicine

## 2016-10-14 DIAGNOSIS — Z5321 Procedure and treatment not carried out due to patient leaving prior to being seen by health care provider: Secondary | ICD-10-CM | POA: Insufficient documentation

## 2016-10-14 DIAGNOSIS — M79602 Pain in left arm: Secondary | ICD-10-CM | POA: Insufficient documentation

## 2016-10-14 HISTORY — DX: Prediabetes: R73.03

## 2016-10-14 NOTE — ED Triage Notes (Signed)
Pt c/o severe left forearm pain since last night. Denies injury. Pt states pain is radiating into upper arm. Pt c/o left side neck pain yesterday. Arm Pain worse with movement.

## 2016-10-21 MED ORDER — SODIUM CHLORIDE 0.9 % IJ SOLN
INTRAMUSCULAR | Status: AC
  Filled 2016-10-21: qty 10

## 2017-03-06 ENCOUNTER — Other Ambulatory Visit: Payer: Self-pay

## 2017-03-06 ENCOUNTER — Encounter (HOSPITAL_COMMUNITY): Payer: Self-pay | Admitting: *Deleted

## 2017-03-06 ENCOUNTER — Observation Stay (HOSPITAL_COMMUNITY)
Admission: EM | Admit: 2017-03-06 | Discharge: 2017-03-07 | Disposition: A | Discharge status: Home / Self Care | Payer: Self-pay | Attending: Family Medicine | Admitting: Family Medicine

## 2017-03-06 ENCOUNTER — Emergency Department (HOSPITAL_COMMUNITY): Payer: Self-pay

## 2017-03-06 DIAGNOSIS — J45902 Unspecified asthma with status asthmaticus: Secondary | ICD-10-CM

## 2017-03-06 DIAGNOSIS — Z79899 Other long term (current) drug therapy: Secondary | ICD-10-CM | POA: Insufficient documentation

## 2017-03-06 DIAGNOSIS — J4521 Mild intermittent asthma with (acute) exacerbation: Secondary | ICD-10-CM

## 2017-03-06 DIAGNOSIS — J45901 Unspecified asthma with (acute) exacerbation: Principal | ICD-10-CM | POA: Diagnosis present

## 2017-03-06 LAB — CBC WITH DIFFERENTIAL/PLATELET
Basophils Absolute: 0 10*3/uL (ref 0.0–0.1)
Basophils Relative: 0 %
EOS PCT: 13 %
Eosinophils Absolute: 1.4 10*3/uL — ABNORMAL HIGH (ref 0.0–0.7)
HEMATOCRIT: 30.7 % — AB (ref 36.0–46.0)
HEMOGLOBIN: 8.9 g/dL — AB (ref 12.0–15.0)
Lymphocytes Relative: 30 %
Lymphs Abs: 3.3 10*3/uL (ref 0.7–4.0)
MCH: 20.6 pg — ABNORMAL LOW (ref 26.0–34.0)
MCHC: 29 g/dL — ABNORMAL LOW (ref 30.0–36.0)
MCV: 70.9 fL — AB (ref 78.0–100.0)
MONO ABS: 0.7 10*3/uL (ref 0.1–1.0)
MONOS PCT: 6 %
Neutro Abs: 5.7 10*3/uL (ref 1.7–7.7)
Neutrophils Relative %: 51 %
Platelets: 346 10*3/uL (ref 150–400)
RBC: 4.33 MIL/uL (ref 3.87–5.11)
RDW: 17.9 % — AB (ref 11.5–15.5)
WBC: 11.1 10*3/uL — ABNORMAL HIGH (ref 4.0–10.5)

## 2017-03-06 LAB — BASIC METABOLIC PANEL
Anion gap: 8 (ref 5–15)
BUN: 13 mg/dL (ref 6–20)
CHLORIDE: 108 mmol/L (ref 101–111)
CO2: 21 mmol/L — AB (ref 22–32)
Calcium: 8.8 mg/dL — ABNORMAL LOW (ref 8.9–10.3)
Creatinine, Ser: 0.63 mg/dL (ref 0.44–1.00)
GFR calc non Af Amer: >60 mL/min (ref >60)
Glucose, Bld: 171 mg/dL — ABNORMAL HIGH (ref 65–99)
Potassium: 3.9 mmol/L (ref 3.5–5.1)
Sodium: 137 mmol/L (ref 135–145)

## 2017-03-06 LAB — INFLUENZA PANEL BY PCR (TYPE A & B)
INFLAPCR: NEGATIVE
Influenza B By PCR: NEGATIVE

## 2017-03-06 MED ORDER — ACETAMINOPHEN 325 MG PO TABS
650.0000 mg | ORAL_TABLET | Freq: Three times a day (TID) | ORAL | Status: DC | PRN
  Filled 2017-03-06: qty 2

## 2017-03-06 MED ORDER — ALBUTEROL SULFATE (2.5 MG/3ML) 0.083% IN NEBU: 2.5000 mg | INHALATION_SOLUTION | Freq: Four times a day (QID) | RESPIRATORY_TRACT | Status: DC

## 2017-03-06 MED ORDER — SODIUM CHLORIDE 0.9 % IV BOLUS (SEPSIS)
1000.0000 mL | Freq: Once | INTRAVENOUS | Status: AC
  Administered 2017-03-06: 1000 mL via INTRAVENOUS

## 2017-03-06 MED ORDER — PREDNISONE 50 MG PO TABS
60.0000 mg | ORAL_TABLET | Freq: Once | ORAL | Status: AC
  Administered 2017-03-06: 60 mg via ORAL
  Filled 2017-03-06: qty 1

## 2017-03-06 MED ORDER — ALBUTEROL SULFATE (2.5 MG/3ML) 0.083% IN NEBU
2.5000 mg | INHALATION_SOLUTION | Freq: Once | RESPIRATORY_TRACT | Status: AC
  Administered 2017-03-06: 2.5 mg via RESPIRATORY_TRACT
  Filled 2017-03-06: qty 3

## 2017-03-06 MED ORDER — IPRATROPIUM BROMIDE 0.02 % IN SOLN
0.5000 mg | Freq: Once | RESPIRATORY_TRACT | Status: AC
  Administered 2017-03-06: 0.5 mg via RESPIRATORY_TRACT
  Filled 2017-03-06: qty 2.5

## 2017-03-06 MED ORDER — SODIUM CHLORIDE 0.9 % IV SOLN
INTRAVENOUS | Status: DC
  Administered 2017-03-06: 11:00:00 via INTRAVENOUS

## 2017-03-06 MED ORDER — METHYLPREDNISOLONE SODIUM SUCC 125 MG IJ SOLR
60.0000 mg | Freq: Four times a day (QID) | INTRAMUSCULAR | Status: DC
  Administered 2017-03-06 – 2017-03-07 (×6): 60 mg via INTRAVENOUS
  Filled 2017-03-06 (×6): qty 2

## 2017-03-06 MED ORDER — MAGNESIUM SULFATE 2 GM/50ML IV SOLN
2.0000 g | Freq: Once | INTRAVENOUS | Status: AC
  Administered 2017-03-06: 2 g via INTRAVENOUS
  Filled 2017-03-06: qty 50

## 2017-03-06 MED ORDER — ALBUTEROL (5 MG/ML) CONTINUOUS INHALATION SOLN
15.0000 mg/h | INHALATION_SOLUTION | Freq: Once | RESPIRATORY_TRACT | Status: AC
  Administered 2017-03-06: 15 mg/h via RESPIRATORY_TRACT
  Filled 2017-03-06: qty 20

## 2017-03-06 MED ORDER — GUAIFENESIN ER 600 MG PO TB12
600.0000 mg | ORAL_TABLET | Freq: Two times a day (BID) | ORAL | Status: DC
  Administered 2017-03-06 – 2017-03-07 (×3): 600 mg via ORAL
  Filled 2017-03-06 (×3): qty 1

## 2017-03-06 MED ORDER — ONDANSETRON HCL 4 MG/2ML IJ SOLN: 4.0000 mg | Freq: Four times a day (QID) | INTRAMUSCULAR | Status: DC | PRN

## 2017-03-06 MED ORDER — IPRATROPIUM-ALBUTEROL 0.5-2.5 (3) MG/3ML IN SOLN
3.0000 mL | Freq: Four times a day (QID) | RESPIRATORY_TRACT | Status: DC
  Administered 2017-03-06 – 2017-03-07 (×5): 3 mL via RESPIRATORY_TRACT
  Filled 2017-03-06 (×6): qty 3

## 2017-03-06 MED ORDER — IPRATROPIUM-ALBUTEROL 0.5-2.5 (3) MG/3ML IN SOLN
3.0000 mL | Freq: Once | RESPIRATORY_TRACT | Status: AC
  Administered 2017-03-06: 3 mL via RESPIRATORY_TRACT
  Filled 2017-03-06: qty 3

## 2017-03-06 MED ORDER — ONDANSETRON HCL 4 MG PO TABS: 4.0000 mg | ORAL_TABLET | Freq: Four times a day (QID) | ORAL | Status: DC | PRN

## 2017-03-06 MED ORDER — ALBUTEROL (5 MG/ML) CONTINUOUS INHALATION SOLN
10.0000 mg/h | INHALATION_SOLUTION | Freq: Once | RESPIRATORY_TRACT | Status: AC
  Administered 2017-03-06: 10 mg/h via RESPIRATORY_TRACT
  Filled 2017-03-06: qty 20

## 2017-03-06 MED ORDER — ENOXAPARIN SODIUM 40 MG/0.4ML ~~LOC~~ SOLN
40.0000 mg | SUBCUTANEOUS | Status: DC
  Administered 2017-03-06: 40 mg via SUBCUTANEOUS
  Filled 2017-03-06: qty 0.4

## 2017-03-06 MED ORDER — IPRATROPIUM BROMIDE 0.02 % IN SOLN: 0.5000 mg | Freq: Four times a day (QID) | RESPIRATORY_TRACT | Status: DC

## 2017-03-06 NOTE — H&P (Signed)
TRH H&P    Patient Demographics:    Lorraine White, is a [age] y.o. female  MRN: [id number]  DOB - 09/29/1985  Admit Date - 03/06/2017  Referring MD/NP/PA: Dr. Lynelle DoctorKnapp  Outpatient Primary MD for the patient is System, Provider Not In  Patient coming from: home  Chief Complaint  Patient presents with  . Shortness of Breath      HPI:    Lorraine White  is a 31 y.o. female, with history of asthma came to hospital with worsening shortness of breath for three days. Patient stated that she covered up thick white and yellow phlegm and has been wheezing. She also has difficulty talking with her voice changed due to shortness of breath. Patient takes inhaler at home but it was not helping her so she came to hospital for further evaluation. Patient was admitted to hospital a few years ago. There were no pets at home but a thick carpet.  She denies chest pain. Denies abdominal pain. Complains of two episodes of vomiting. No diarrhea. No dysuria, urgency or frequency of urination.  In the ED patient found to be an asthma exacerbation. Somewhat improved with the blood treatment.    Review of systems:     All other systems reviewed and are negative.   With Past History of the following :    Past Medical History:  Diagnosis Date  . Asthma   . Prediabetes       History reviewed. No pertinent surgical history.    Social History:      Social History   Tobacco Use  . Smoking status: Never Smoker  . Smokeless tobacco: Never Used  Substance Use Topics  . Alcohol use: Yes    Comment: occasional       Family History :   Patient's mother also has a history of asthma   Home Medications:   Prior to Admission medications   Medication Sig Start Date End Date Taking? Authorizing Provider  acetaminophen (TYLENOL) 650 MG CR tablet Take 650 mg by mouth every 8 (eight) hours as needed for fever.     [provider]  albuterol (PROVENTIL HFA;VENTOLIN HFA) 108 (90 BASE) MCG/ACT inhaler Inhale 2 puffs into the lungs every 6 (six) hours as needed for wheezing or shortness of breath.    [provider]  albuterol (PROVENTIL) (2.5 MG/3ML) 0.083% nebulizer solution Take 3 mLs (2.5 mg total) by nebulization every 4 (four) hours as needed for wheezing. 04/24/14   Standley BrookingGoodrich, Daniel P, MD  amoxicillin-clavulanate (AUGMENTIN) 875-125 MG per tablet Take 1 tablet by mouth every 12 (twelve) hours. 04/24/14   Standley BrookingGoodrich, Daniel P, MD  fluticasone (FLOVENT HFA) 44 MCG/ACT inhaler Inhale 2 puffs into the lungs 2 (two) times daily. 04/24/14   Standley BrookingGoodrich, Daniel P, MD  magnesium citrate SOLN Take 1 Bottle by mouth daily as needed for mild constipation or severe constipation.    [provider]  predniSONE (DELTASONE) 10 MG tablet Take 40 mg by mouth daily for 3 days, then take 20 mg by mouth daily for  3 days, then take 10 mg by mouth daily for 3 days, then stop. 04/24/14   Standley BrookingGoodrich, Daniel P, MD     Allergies:    No Known Allergies   Physical Exam:   Vitals  Blood pressure 135/80, pulse (!) 112, temperature 97.9 F (36.6 C), temperature source Oral, resp. rate 20, weight 90.7 kg (200 lb), SpO2 95 %.  1.  General: appears in no acute distress  2. Psychiatric:  Intact judgement and  insight, awake alert, oriented x 3.  3. Neurologic: No focal neurological deficits, all cranial nerves intact.Strength 5/5 all 4 extremities, sensation intact all 4 extremities, plantars down going.  4. Eyes :  anicteric sclerae, moist conjunctivae with no lid lag. PERRLA.  5. ENMT:  Oropharynx clear with moist mucous membranes and good dentition  6. Neck:  supple, no cervical lymphadenopathy appriciated, No thyromegaly  7. Respiratory : Normal respiratory effort, bilateral wheezing auscultated  8. Cardiovascular : RRR, no gallops, rubs or murmurs, no leg edema  9. Gastrointestinal:  Positive  bowel sounds, abdomen soft, non-tender to palpation,no hepatosplenomegaly, no rigidity or guarding       10. Skin:  No cyanosis, normal texture and turgor, no rash, lesions or ulcers  11.Musculoskeletal:  Good muscle tone,  joints appear normal , no effusions,  normal range of motion    Data Review:    CBC Recent Labs  Lab 03/06/17 0320  WBC 11.1*  HGB 8.9*  HCT 30.7*  PLT 346  MCV 70.9*  MCH 20.6*  MCHC 29.0*  RDW 17.9*  LYMPHSABS 3.3  MONOABS 0.7  EOSABS 1.4*  BASOSABS 0.0   ------------------------------------------------------------------------------------------------------------------  Chemistries  Recent Labs  Lab 03/06/17 0320  NA 137  K 3.9  CL 108  CO2 21*  GLUCOSE 171*  BUN 13  CREATININE 0.63  CALCIUM 8.8*   ------------------------------------------------------------------------------------------------------------------  ------------------------------------------------------------------------------------------------------------------  --------------------------------------------------------------------------------------------------------------- Urine analysis:    Component Value Date/Time   COLORURINE YELLOW 07/24/2010 0640   APPEARANCEUR CLEAR 07/24/2010 0640   LABSPEC 1.020 07/24/2010 0640   PHURINE 7.0 07/24/2010 0640   GLUCOSEU NEGATIVE 07/24/2010 0640   HGBUR NEGATIVE 07/24/2010 0640   BILIRUBINUR NEGATIVE 07/24/2010 0640   KETONESUR NEGATIVE 07/24/2010 0640   PROTEINUR NEGATIVE 07/24/2010 0640   UROBILINOGEN 0.2 07/24/2010 0640   NITRITE NEGATIVE 07/24/2010 0640   LEUKOCYTESUR  07/24/2010 0640    NEGATIVE MICROSCOPIC NOT DONE ON URINES WITH NEGATIVE PROTEIN, BLOOD, LEUKOCYTES, NITRITE, OR GLUCOSE <1000 mg/dL.      Imaging Results:      Assessment & Plan:    Active Problems:   Asthma exacerbation   1. Asthma exacerbation-placed under observation, Solu-Medrol 60 mg IV to six hours, duo neb nebulizers Q6 hours, Mucinex one   PO BID.    DVT Prophylaxis-   Lovenox   AM Labs Ordered, also please review Full Orders  Family Communication: Admission, patients condition and plan of care including tests being ordered have been discussed with the patient and her mother's bedside* who indicate understanding and agree with the plan and Code Status.  Code Status: full code  Admission status: observation  Time spent in minutes : 50 minutes   Meredeth IdeGagan S Tykeisha Peer M.D on 03/06/2017 at 6:11 AM  Between 7am to 7pm - Pager - [phone]. After 7pm go to [email or web address] - password [email or web address]  [facility] - Office  [phone]

## 2017-03-06 NOTE — ED Provider Notes (Signed)
Lorraine White Provider Note   CSN: [id number] Arrival date & time: 03/06/17  0119  Time seen 01:50 AM   History   Chief Complaint Chief Complaint  Patient presents with  . Shortness of Breath   Level 5 caveat for respiratory distress  HPI Lorraine White is a 31 y.o. female.  HPI   my patient states she started having difficulty breathing about 3 days ago.  She has had some cough with some yellow white sputum production but states the main problem has been feeling short of breath and wheezing.  She had a leftover inhaler and nebulizers from several years ago that she has been using and they did help until today.  She also had some leftover Singulair that she is been taking this month and this is also left over from several years ago.  She denies any fever.  She is states the last time she was admitted was a few years ago, but the last time she was on steroids was a few years ago.  She is not sure of any precipitating events but she had been cleaning and it was very dusty.  PCP none  Past Medical History:  Diagnosis Date  . Asthma   . Prediabetes     Patient Active Problem List   Diagnosis Date Noted  . Acute respiratory failure with hypoxia (HCC) 04/24/2014  . Asthma exacerbation 04/24/2014  . Acute sinusitis 04/24/2014  . Microcytic anemia 04/24/2014  . Status asthmaticus 04/22/2014  . Tachycardia 04/22/2014    History reviewed. No pertinent surgical history.  OB History    No data available       Home Medications    Prior to Admission medications   Medication Sig Start Date End Date Taking? Authorizing Provider  acetaminophen (TYLENOL) 650 MG CR tablet Take 650 mg by mouth every 8 (eight) hours as needed for fever.    [provider]  albuterol (PROVENTIL HFA;VENTOLIN HFA) 108 (90 BASE) MCG/ACT inhaler Inhale 2 puffs into the lungs every 6 (six) hours as needed for wheezing or shortness of breath.    [provider]    albuterol (PROVENTIL) (2.5 MG/3ML) 0.083% nebulizer solution Take 3 mLs (2.5 mg total) by nebulization every 4 (four) hours as needed for wheezing. 04/24/14   Standley BrookingGoodrich, Daniel P, MD  amoxicillin-clavulanate (AUGMENTIN) 875-125 MG per tablet Take 1 tablet by mouth every 12 (twelve) hours. 04/24/14   Standley BrookingGoodrich, Daniel P, MD  fluticasone (FLOVENT HFA) 44 MCG/ACT inhaler Inhale 2 puffs into the lungs 2 (two) times daily. 04/24/14   Standley BrookingGoodrich, Daniel P, MD  magnesium citrate SOLN Take 1 Bottle by mouth daily as needed for mild constipation or severe constipation.    [provider]  predniSONE (DELTASONE) 10 MG tablet Take 40 mg by mouth daily for 3 days, then take 20 mg by mouth daily for 3 days, then take 10 mg by mouth daily for 3 days, then stop. 04/24/14   Standley BrookingGoodrich, Daniel P, MD    Family History No family history on file.  Social History Social History   Tobacco Use  . Smoking status: Never Smoker  . Smokeless tobacco: Never Used  Substance Use Topics  . Alcohol use: Yes    Comment: occasional  . Drug use: No  employed   Allergies   Patient has no known allergies.   Review of Systems Review of Systems  All other systems reviewed and are negative.    Physical Exam Updated Vital Signs BP Marland Kitchen(!)  141/87 (BP Location: Left Arm)   Pulse (!) 117   Temp 97.9 F (36.6 C) (Oral)   Resp (!) 32   Wt 90.7 kg (200 lb)   SpO2 95%   BMI 33.28 kg/m   Vital signs normal except for tachycardia   Physical Exam  Constitutional: She is oriented to person, place, and time. She appears well-developed and well-nourished.  Non-toxic appearance. She does not appear ill. She appears distressed.  HENT:  Head: Normocephalic and atraumatic.  Right Ear: External ear normal.  Left Ear: External ear normal.  Nose: Nose normal. No mucosal edema or rhinorrhea.  Mouth/Throat: Oropharynx is clear and moist and mucous membranes are normal. No dental abscesses or uvula swelling.  Eyes: Conjunctivae  and EOM are normal. Pupils are equal, round, and reactive to light.  Neck: Normal range of motion and full passive range of motion without pain. Neck supple.  Cardiovascular: Normal rate, regular rhythm and normal heart sounds. Exam reveals no gallop and no friction rub.  No murmur heard. Pulmonary/Chest: Accessory muscle usage present. Tachypnea noted. No respiratory distress. She has decreased breath sounds. She has wheezes. She has rhonchi. She has no rales. She exhibits no tenderness and no crepitus.  Patient is having audible wheezing and gets tachypneic with talking  Abdominal: Soft. Normal appearance and bowel sounds are normal. She exhibits no distension. There is no tenderness. There is no rebound and no guarding.  Musculoskeletal: Normal range of motion. She exhibits no edema or tenderness.  Moves all extremities well.   Neurological: She is alert and oriented to person, place, and time. She has normal strength. No cranial nerve deficit.  Skin: Skin is warm, dry and intact. No rash noted. No erythema. No pallor.  Psychiatric: She has a normal mood and affect. Her speech is normal and behavior is normal. Her mood appears not anxious.  Nursing note and vitals reviewed.    ED Treatments / Results  Labs (all labs ordered are listed, but only abnormal results are displayed) Results for orders placed or performed during the hospital encounter of 03/06/17  Basic metabolic panel  Result Value Ref Range   Sodium 137 135 - 145 mmol/L   Potassium 3.9 3.5 - 5.1 mmol/L   Chloride 108 101 - 111 mmol/L   CO2 21 (L) 22 - 32 mmol/L   Glucose, Bld 171 (H) 65 - 99 mg/dL   BUN 13 6 - 20 mg/dL   Creatinine, Ser 0.980.63 0.44 - 1.00 mg/dL   Calcium 8.8 (L) 8.9 - 10.3 mg/dL   GFR calc non Af Amer >60 >60 mL/min   GFR calc Af Amer >60 >60 mL/min   Anion gap 8 5 - 15  CBC with Differential  Result Value Ref Range   WBC 11.1 (H) 4.0 - 10.5 K/uL   RBC 4.33 3.87 - 5.11 MIL/uL   Hemoglobin 8.9 (L) 12.0  - 15.0 g/dL   HCT 11.930.7 (L) 14.736.0 - 82.946.0 %   MCV 70.9 (L) 78.0 - 100.0 fL   MCH 20.6 (L) 26.0 - 34.0 pg   MCHC 29.0 (L) 30.0 - 36.0 g/dL   RDW 56.217.9 (H) 13.011.5 - 86.515.5 %   Platelets 346 150 - 400 K/uL   Neutrophils Relative % 51 %   Lymphocytes Relative 30 %   Monocytes Relative 6 %   Eosinophils Relative 13 %   Basophils Relative 0 %   Neutro Abs 5.7 1.7 - 7.7 K/uL   Lymphs Abs 3.3 0.7 - 4.0  K/uL   Monocytes Absolute 0.7 0.1 - 1.0 K/uL   Eosinophils Absolute 1.4 (H) 0.0 - 0.7 K/uL   Basophils Absolute 0.0 0.0 - 0.1 K/uL   RBC Morphology POLYCHROMASIA PRESENT    WBC Morphology ATYPICAL LYMPHOCYTES    Laboratory interpretation all normal except stable anemia, leukocytosis, hyperglycemia    EKG  EKG Interpretation None       Radiology Dg Chest Port 1 View  Result Date: 03/06/2017 CLINICAL DATA:  Acute onset of shortness of breath. EXAM: PORTABLE CHEST 1 VIEW COMPARISON:  Chest radiograph performed 04/22/2014 FINDINGS: The lungs are well-aerated. Peribronchial thickening is noted. Mild vascular congestion is seen. There is no evidence of focal opacification, pleural effusion or pneumothorax. The cardiomediastinal silhouette is within normal limits. No acute osseous abnormalities are seen. IMPRESSION: Peribronchial thickening noted. Mild vascular congestion seen. Lungs otherwise grossly clear. Electronically Signed   By: Roanna Raider M.D.   On: 03/06/2017 06:32      Procedures .Critical Care Performed by: Devoria Albe, MD Authorized by: Devoria Albe, MD   Critical care provider statement:    Critical care time (minutes):  43   Critical care was necessary to treat or prevent imminent or life-threatening deterioration of the following conditions:  Respiratory failure   Critical care was time spent personally by me on the following activities:  Discussions with consultants, examination of patient, obtaining history from patient or surrogate, ordering and review of laboratory studies,  ordering and review of radiographic studies, pulse oximetry and re-evaluation of patient's condition   (including critical care time)  Medications Ordered in ED Medications  ipratropium-albuterol (DUONEB) 0.5-2.5 (3) MG/3ML nebulizer solution 3 mL (3 mLs Nebulization Given 03/06/17 0136)  albuterol (PROVENTIL) (2.5 MG/3ML) 0.083% nebulizer solution 2.5 mg (2.5 mg Nebulization Given 03/06/17 0136)  predniSONE (DELTASONE) tablet 60 mg (60 mg Oral Given 03/06/17 0212)  albuterol (PROVENTIL,VENTOLIN) solution continuous neb (15 mg/hr Nebulization Given 03/06/17 0201)  ipratropium (ATROVENT) nebulizer solution 0.5 mg (0.5 mg Nebulization Given 03/06/17 0201)  sodium chloride 0.9 % bolus 1,000 mL (0 mLs Intravenous Stopped 03/06/17 0401)  magnesium sulfate IVPB 2 g 50 mL (0 g Intravenous Stopped 03/06/17 0454)  albuterol (PROVENTIL,VENTOLIN) solution continuous neb (10 mg/hr Nebulization Given 03/06/17 0409)  ipratropium (ATROVENT) nebulizer solution 0.5 mg (0.5 mg Nebulization Given 03/06/17 0409)     Initial Impression / Assessment and Plan / ED Course  I have reviewed the triage vital signs and the nursing notes.  Pertinent labs & imaging results that were available during my care of the patient were reviewed by me and considered in my medical decision making (see chart for details).     Patient was started on a continuous nebulizer, she states she can swallow pills and she was given oral prednisone.  Recheck it to  2:05 AM patient is almost at the end of her continuous nebulizer.  She states she is feeling better.  However she still has diffuse inspiratory and expiratory wheezing although not as audible they are still audible at times.  A second continuous nebulizer was ordered.  Recheck at 3:20 AM patient still has diffuse wheezing, she also still has some audible wheezing.  At this point IV was placed and patient was given a fluid bolus, lab work was done, and she was given IV  magnesium.  Recheck at 3:50 AM patient has not gotten her second continuous nebulizer, however she has finished her IV magnesium.  She still has diffuse wheezing.  We discussed she would probably  need to be admitted and she is agreeable.  Recheck at 5:40 AM patient still has diffuse wheezing, she no longer has audible wheezing.  5:42 AM patient discussed with Dr. Sharl Ma, hospitalist, he would like a chest x-ray done, portable chest x-ray ordered, he will admit.  Final Clinical Impressions(s) / ED Diagnoses   Final diagnoses:  Exacerbation of intermittent asthma, unspecified asthma severity  Moderate asthma with status asthmaticus, unspecified whether persistent    Plan admission  Devoria Albe, MD, Concha Pyo, MD 03/06/17 [id number]

## 2017-03-06 NOTE — Progress Notes (Signed)
PROGRESS NOTE    Lorraine White Tuccillo  JXB:[id number] DOB: 09/16/1985 DOA: 03/06/2017 PCP: System, Provider Not In    Brief Narrative:  Lorraine White Feger  is a 31 y.o. female, with history of asthma came to hospital with worsening shortness of breath for three days. Patient stated that she covered up thick white and yellow phlegm and has been wheezing. She also has difficulty talking with her voice changed due to shortness of breath. Patient takes inhaler at home but it was not helping her so she came to hospital for further evaluation. Patient was admitted to hospital a few years ago. There were no pets at home but a thick carpet.  She denies chest pain. Denies abdominal pain. Complains of two episodes of vomiting. No diarrhea. No dysuria, urgency or frequency of urination.  In the ED patient found to be an asthma exacerbation.      Assessment & Plan:   Active Problems:   Asthma exacerbation  Asthma exacerbation -Patient received 2 g of IV magnesium this morning in the emergency department -Solu-Medrol 60 mg IV every 6 hours -DuoNeb's nebulizers every 6 hours -Mucinex p.o. twice daily - Wean supplemental oxygen as tolerated -We will order albuterol every 2 hours as needed wheezing and shortness of breath   Anemia, microcytic -We will draw iron studies   DVT prophylaxis: Lovenox Code Status:full code Family Communication: mother is bedside Disposition Plan: will likely discharge back to home when improved   Consultants:   None  Procedures:   None  Antimicrobials:   None    Subjective: Patient reports minimal improvement in her shortness of breath at time of exam.  She still feels tight in her chest.  She is only able to speak in phrases.  Objective: Vitals:   03/06/17 0412 03/06/17 0526 03/06/17 0743 03/06/17 0804  BP:  135/80 125/85   Pulse:  (!) 112 (!) 115   Resp:  20 20   Temp:   98.2 F (36.8 C)   TempSrc:   Oral   SpO2: 94% 95% 96% 92%  Weight:    91.6 kg (202 lb)   Height:   5\' 5"  (1.651 m)     Intake/Output Summary (Last 24 hours) at 03/06/2017 1055 Last data filed at 03/06/2017 0952 Gross per 24 hour  Intake 1050 ml  Output 300 ml  Net 750 ml   Filed Weights   03/06/17 0132 03/06/17 0743  Weight: 90.7 kg (200 lb) 91.6 kg (202 lb)    Examination:  General exam: Moderate distress Respiratory system: Azle cannula in place, sitting up during exam, diminished breath sounds throughout, expiratory wheezing Cardiovascular system: S1 & S2 heard, RRR. No JVD, murmurs, rubs, gallops or clicks. No pedal edema. Gastrointestinal system: Abdomen is obese, nondistended, soft and nontender. No organomegaly or masses felt. Normal bowel sounds heard. Central nervous system: Alert and oriented. No focal neurological deficits. Extremities: Symmetric 5 x 5 power. Skin: No rashes, lesions or ulcers Psychiatry: Judgement and insight appear normal. Mood & affect appropriate.     Data Reviewed: I have personally reviewed following labs and imaging studies  CBC: Recent Labs  Lab 03/06/17 0320  WBC 11.1*  NEUTROABS 5.7  HGB 8.9*  HCT 30.7*  MCV 70.9*  PLT 346   Basic Metabolic Panel: Recent Labs  Lab 03/06/17 0320  NA 137  K 3.9  CL 108  CO2 21*  GLUCOSE 171*  BUN 13  CREATININE 0.63  CALCIUM 8.8*   GFR: Estimated Creatinine Clearance: 114.9 mL/min (  by C-G formula based on SCr of 0.63 mg/dL). Liver Function Tests: No results for input(s): AST, ALT, ALKPHOS, BILITOT, PROT, ALBUMIN in the last 168 hours. No results for input(s): LIPASE, AMYLASE in the last 168 hours. No results for input(s): AMMONIA in the last 168 hours. Coagulation Profile: No results for input(s): INR, PROTIME in the last 168 hours. Cardiac Enzymes: No results for input(s): CKTOTAL, CKMB, CKMBINDEX, TROPONINI in the last 168 hours. BNP (last 3 results) No results for input(s): PROBNP in the last 8760 hours. HbA1C: No results for input(s): HGBA1C in  the last 72 hours. CBG: No results for input(s): GLUCAP in the last 168 hours. Lipid Profile: No results for input(s): CHOL, HDL, LDLCALC, TRIG, CHOLHDL, LDLDIRECT in the last 72 hours. Thyroid Function Tests: No results for input(s): TSH, T4TOTAL, FREET4, T3FREE, THYROIDAB in the last 72 hours. Anemia Panel: No results for input(s): VITAMINB12, FOLATE, FERRITIN, TIBC, IRON, RETICCTPCT in the last 72 hours. Sepsis Labs: No results for input(s): PROCALCITON, LATICACIDVEN in the last 168 hours.  No results found for this or any previous visit (from the past 240 hour(s)).       Radiology Studies: Dg Chest Port 1 View  Result Date: 03/06/2017 CLINICAL DATA:  Acute onset of shortness of breath. EXAM: PORTABLE CHEST 1 VIEW COMPARISON:  Chest radiograph performed 04/22/2014 FINDINGS: The lungs are well-aerated. Peribronchial thickening is noted. Mild vascular congestion is seen. There is no evidence of focal opacification, pleural effusion or pneumothorax. The cardiomediastinal silhouette is within normal limits. No acute osseous abnormalities are seen. IMPRESSION: Peribronchial thickening noted. Mild vascular congestion seen. Lungs otherwise grossly clear. Electronically Signed   By: Roanna RaiderJeffery  Chang M.D.   On: 03/06/2017 06:32        Scheduled Meds: . enoxaparin (LOVENOX) injection  40 mg Subcutaneous Q24H  . guaiFENesin  600 mg Oral BID  . ipratropium-albuterol  3 mL Nebulization Q6H  . methylPREDNISolone (SOLU-MEDROL) injection  60 mg Intravenous Q6H   Continuous Infusions: . sodium chloride 10 mL/hr at 03/06/17 1042     LOS: 0 days    Time spent: 35 minutes    Katrinka BlazingAlex U Kadolph, MD [facility] Pager [phone]  If 7PM-7AM, please contact night-coverage [email or web address] Password [email or web address] 03/06/2017, 10:55 AM

## 2017-03-06 NOTE — ED Triage Notes (Signed)
Pt reports she has asthma and started having trouble breathing x 3 days ago. Pt reports she has been using her neb treatments and rescue inhaler with out relief. Pt states st about 12:30 am, her breathing became worse.

## 2017-03-06 NOTE — ED Notes (Signed)
[facility] Nurse will give report to her replacement and will call later when ready to bring up.

## 2017-03-07 DIAGNOSIS — J4521 Mild intermittent asthma with (acute) exacerbation: Secondary | ICD-10-CM

## 2017-03-07 LAB — CBC
HCT: 30.2 % — ABNORMAL LOW (ref 36.0–46.0)
Hemoglobin: 8.7 g/dL — ABNORMAL LOW (ref 12.0–15.0)
MCH: 20.3 pg — AB (ref 26.0–34.0)
MCHC: 28.8 g/dL — ABNORMAL LOW (ref 30.0–36.0)
MCV: 70.4 fL — AB (ref 78.0–100.0)
PLATELETS: 355 10*3/uL (ref 150–400)
RBC: 4.29 MIL/uL (ref 3.87–5.11)
RDW: 18.2 % — AB (ref 11.5–15.5)
WBC: 15.7 10*3/uL — AB (ref 4.0–10.5)

## 2017-03-07 LAB — COMPREHENSIVE METABOLIC PANEL
ALT: 15 U/L (ref 14–54)
AST: 16 U/L (ref 15–41)
Albumin: 3.5 g/dL (ref 3.5–5.0)
Alkaline Phosphatase: 51 U/L (ref 38–126)
Anion gap: 8 (ref 5–15)
BUN: 11 mg/dL (ref 6–20)
CHLORIDE: 106 mmol/L (ref 101–111)
CO2: 19 mmol/L — AB (ref 22–32)
Calcium: 9.1 mg/dL (ref 8.9–10.3)
Creatinine, Ser: 0.73 mg/dL (ref 0.44–1.00)
Glucose, Bld: 209 mg/dL — ABNORMAL HIGH (ref 65–99)
POTASSIUM: 4.4 mmol/L (ref 3.5–5.1)
SODIUM: 133 mmol/L — AB (ref 135–145)
Total Bilirubin: 0.5 mg/dL (ref 0.3–1.2)
Total Protein: 7.3 g/dL (ref 6.5–8.1)

## 2017-03-07 LAB — HIV ANTIBODY (ROUTINE TESTING W REFLEX): HIV SCREEN 4TH GENERATION: NONREACTIVE

## 2017-03-07 MED ORDER — GUAIFENESIN ER 600 MG PO TB12: 600.0000 mg | ORAL_TABLET | Freq: Two times a day (BID) | ORAL | 0 refills | Status: DC

## 2017-03-07 MED ORDER — ALBUTEROL SULFATE HFA 108 (90 BASE) MCG/ACT IN AERS: 2.0000 | INHALATION_SPRAY | Freq: Four times a day (QID) | RESPIRATORY_TRACT | 0 refills | Status: DC | PRN

## 2017-03-07 MED ORDER — PREDNISONE 10 MG PO TABS: 40.0000 mg | ORAL_TABLET | Freq: Every day | ORAL | 0 refills | Status: AC

## 2017-03-07 MED ORDER — IPRATROPIUM-ALBUTEROL 0.5-2.5 (3) MG/3ML IN SOLN: 3.0000 mL | Freq: Four times a day (QID) | RESPIRATORY_TRACT | 0 refills | Status: AC

## 2017-03-07 MED ORDER — MONTELUKAST SODIUM 10 MG PO TABS: 10.0000 mg | ORAL_TABLET | Freq: Every day | ORAL | 0 refills | Status: AC

## 2017-03-07 MED ORDER — GUAIFENESIN ER 600 MG PO TB12: 600.0000 mg | ORAL_TABLET | Freq: Two times a day (BID) | ORAL | 0 refills | Status: AC

## 2017-03-07 MED ORDER — ALBUTEROL SULFATE HFA 108 (90 BASE) MCG/ACT IN AERS: 2.0000 | INHALATION_SPRAY | Freq: Four times a day (QID) | RESPIRATORY_TRACT | 0 refills | Status: AC | PRN

## 2017-03-07 MED ORDER — IPRATROPIUM-ALBUTEROL 0.5-2.5 (3) MG/3ML IN SOLN: 3.0000 mL | Freq: Four times a day (QID) | RESPIRATORY_TRACT | 0 refills | Status: DC

## 2017-03-07 MED ORDER — MONTELUKAST SODIUM 10 MG PO TABS: 10.0000 mg | ORAL_TABLET | Freq: Every day | ORAL | 0 refills | Status: DC

## 2017-03-07 NOTE — Discharge Summary (Signed)
Physician Discharge Summary  Lorraine StanfordBrittny White WUJ:[id number] DOB: 09/24/1985 DOA: 03/06/2017  PCP: System, Provider Not In  Admit date: 03/06/2017 Discharge date: 03/07/2017  Admitted From: Home Disposition: Home  Recommendations for Outpatient Follow-up:  1. Establish care with a primary care physician 2. Discussed workup for anemia 3. Asthma medications as prescribed  Home Health: None Equipment/Devices: None  Discharge Condition: Stable CODE STATUS: Full code Diet recommendation: Regular  Brief/Interim Summary: Lorraine White a31 y.o.female,with history of asthma came to hospital with worsening shortness of breath for three days. Patient stated that she covered up thick white and yellow phlegm and has been wheezing. She also has difficulty talking with her voice changed due to shortness of breath. Patient takes inhaler at home but it was not helping her so she came to hospital for further evaluation. Patient was admitted to hospital a few years ago. There were no pets at home butathick carpet.  She denies chest pain. Denies abdominal pain. Complains of two episodes of vomiting. No diarrhea. No dysuria, urgency or frequency of urination.  In the ED patient found to be an asthma exacerbation. Patient even IV magnesium in the emergency department as well as started on duo nebs and IV steroids.  She was watched overnight and had improved on 03/07/2017 after receiving numerous breathing treatments.  She was on room air at time of discharge.  She asked for her prescriptions to be printed so she could use a Buyer, retailmatch voucher as she states she does not currently have insurance.  She states she will try to establish care with a primary care provider within the next few weeks to follow-up on her asthma as well as her anemia.  Discharge Diagnoses:  Active Problems:   Asthma exacerbation    Discharge Instructions   Allergies as of 03/07/2017   No Known Allergies     Medication  List    TAKE these medications   acetaminophen 650 MG CR tablet Commonly known as:  TYLENOL Take 650 mg by mouth every 8 (eight) hours as needed for fever.   albuterol (2.5 MG/3ML) 0.083% nebulizer solution Commonly known as:  PROVENTIL Take 3 mLs (2.5 mg total) by nebulization every 4 (four) hours as needed for wheezing.   albuterol 108 (90 Base) MCG/ACT inhaler Commonly known as:  PROVENTIL HFA;VENTOLIN HFA Inhale 2 puffs into the lungs every 6 (six) hours as needed for wheezing or shortness of breath.   guaiFENesin 600 MG 12 hr tablet Commonly known as:  MUCINEX Take 1 tablet (600 mg total) by mouth 2 (two) times daily for 4 days.   ipratropium-albuterol 0.5-2.5 (3) MG/3ML Soln Commonly known as:  DUONEB Take 3 mLs by nebulization every 6 (six) hours for 5 days.   loratadine 10 MG tablet Commonly known as:  CLARITIN Take 10 mg by mouth daily.   montelukast 10 MG tablet Commonly known as:  SINGULAIR Take 1 tablet (10 mg total) by mouth at bedtime.   predniSONE 10 MG tablet Commonly known as:  DELTASONE Take 4 tablets (40 mg total) by mouth daily for 3 days.       No Known Allergies  Consultations: None   Procedures/Studies: Dg Chest Port 1 View  Result Date: 03/06/2017 CLINICAL DATA:  Acute onset of shortness of breath. EXAM: PORTABLE CHEST 1 VIEW COMPARISON:  Chest radiograph performed 04/22/2014 FINDINGS: The lungs are well-aerated. Peribronchial thickening is noted. Mild vascular congestion is seen. There is no evidence of focal opacification, pleural effusion or pneumothorax. The cardiomediastinal silhouette is within  normal limits. No acute osseous abnormalities are seen. IMPRESSION: Peribronchial thickening noted. Mild vascular congestion seen. Lungs otherwise grossly clear. Electronically Signed   By: Roanna Raider M.D.   On: 03/06/2017 06:32     Subjective: Patient seen and examined.  She voices her breathing is much better today than it was yesterday.   She asked for refill on some of her chronic asthma medications as she states that it has been at least 2 years since seeing a primary care physician and she believes she will now be listed as a new patient.  Discharge Exam: Vitals:   03/07/17 0443 03/07/17 0749  BP: (!) 102/48   Pulse: 83   Resp: (!) 22   Temp: 98.8 F (37.1 C)   SpO2: 94% 92%   Vitals:   03/06/17 1934 03/07/17 0139 03/07/17 0443 03/07/17 0749  BP:   (!) 102/48   Pulse: (!) 118  83   Resp: (!) 24  (!) 22   Temp:   98.8 F (37.1 C)   TempSrc:   Oral   SpO2: 95% 92% 94% 92%  Weight:      Height:        General: Pt is alert, awake, not in acute distress Cardiovascular: RRR, S1/S2 +, no rubs, no gallops Respiratory: Slightly increased work of breathing, no wheezing appreciated, improved airflow throughout lung fields from prior exam Abdominal: Soft, obese, NT, ND, bowel sounds + Extremities: no edema, no cyanosis    The results of significant diagnostics from this hospitalization (including imaging, microbiology, ancillary and laboratory) are listed below for reference.     Microbiology: No results found for this or any previous visit (from the past 240 hour(s)).   Labs: BNP (last 3 results) No results for input(s): BNP in the last 8760 hours. Basic Metabolic Panel: Recent Labs  Lab 03/06/17 0320 03/07/17 0605  NA 137 133*  K 3.9 4.4  CL 108 106  CO2 21* 19*  GLUCOSE 171* 209*  BUN 13 11  CREATININE 0.63 0.73  CALCIUM 8.8* 9.1   Liver Function Tests: Recent Labs  Lab 03/07/17 0605  AST 16  ALT 15  ALKPHOS 51  BILITOT 0.5  PROT 7.3  ALBUMIN 3.5   No results for input(s): LIPASE, AMYLASE in the last 168 hours. No results for input(s): AMMONIA in the last 168 hours. CBC: Recent Labs  Lab 03/06/17 0320 03/07/17 0605  WBC 11.1* 15.7*  NEUTROABS 5.7  --   HGB 8.9* 8.7*  HCT 30.7* 30.2*  MCV 70.9* 70.4*  PLT 346 355   Cardiac Enzymes: No results for input(s): CKTOTAL, CKMB,  CKMBINDEX, TROPONINI in the last 168 hours. BNP: Invalid input(s): POCBNP CBG: No results for input(s): GLUCAP in the last 168 hours. D-Dimer No results for input(s): DDIMER in the last 72 hours. Hgb A1c No results for input(s): HGBA1C in the last 72 hours. Lipid Profile No results for input(s): CHOL, HDL, LDLCALC, TRIG, CHOLHDL, LDLDIRECT in the last 72 hours. Thyroid function studies No results for input(s): TSH, T4TOTAL, T3FREE, THYROIDAB in the last 72 hours.  Invalid input(s): FREET3 Anemia work up No results for input(s): VITAMINB12, FOLATE, FERRITIN, TIBC, IRON, RETICCTPCT in the last 72 hours. Urinalysis    Component Value Date/Time   COLORURINE YELLOW 07/24/2010 0640   APPEARANCEUR CLEAR 07/24/2010 0640   LABSPEC 1.020 07/24/2010 0640   PHURINE 7.0 07/24/2010 0640   GLUCOSEU NEGATIVE 07/24/2010 0640   HGBUR NEGATIVE 07/24/2010 0640   BILIRUBINUR NEGATIVE 07/24/2010 5621  KETONESUR NEGATIVE 07/24/2010 0640   PROTEINUR NEGATIVE 07/24/2010 0640   UROBILINOGEN 0.2 07/24/2010 0640   NITRITE NEGATIVE 07/24/2010 0640   LEUKOCYTESUR  07/24/2010 0640    NEGATIVE MICROSCOPIC NOT DONE ON URINES WITH NEGATIVE PROTEIN, BLOOD, LEUKOCYTES, NITRITE, OR GLUCOSE <1000 mg/dL.   Sepsis Labs Invalid input(s): PROCALCITONIN,  WBC,  LACTICIDVEN Microbiology No results found for this or any previous visit (from the past 240 hour(s)).   Time coordinating discharge: Over 30 minutes  SIGNED:   Katrinka BlazingAlex U Kadolph, MD  [facility] 03/07/2017, 9:46 AM Pager [phone] If 7PM-7AM, please contact night-coverage [email or web address] Password [email or web address]

## 2017-03-07 NOTE — Care Management Note (Signed)
Case Management Note  Patient Details  Name: Lorraine StanfordBrittny Ostrom MRN: [id number] Date of Birth: 05/08/1985  Subjective/Objective:           Admitted with bronchitis. [place] from home, [place]. She is ind with ADL's. Has no insurance and no PCP. [facility] She has a neb machine pta.          Action/Plan: Being DC'd with nebs and steroid taper. Pt given MATCH voucher and Good Rx card for use after this month. Pt will f/u with [facility] clinic.   Expected Discharge Date:  03/08/17               Expected Discharge Plan:  Home/Self Care  In-House Referral:  NA  Discharge planning Services  [facility]  Post Acute Care Choice:  NA Choice offered to:  NA  Status of Service:  Completed, signed off  Malcolm MetroChildress, Hektor Huston Demske, RN 03/07/2017, 12:15 PM

## 2017-03-07 NOTE — Progress Notes (Signed)
Pt's IV catheter removed and intact. Pt's IV site clean dry and intact. Discharge instructions including medications and follow up appointments were reviewed and discussed with patient. All questions were answered and no further questions at this time. Pt in stable condition and in no acute distress at time of discharge. Pt will be escorted by nurse tech.  

## 2017-03-08 LAB — HEMOGLOBIN A1C
HEMOGLOBIN A1C: 8.3 % — AB (ref 4.8–5.6)
MEAN PLASMA GLUCOSE: 192 mg/dL

## 2018-05-26 ENCOUNTER — Ambulatory Visit: Admit: 2018-05-26 | Discharge: 2018-05-26 | Payer: PRIVATE HEALTH INSURANCE | Attending: Obstetrics & Gynecology

## 2018-05-26 ENCOUNTER — Ambulatory Visit: Attending: Obstetrics & Gynecology

## 2018-05-26 DIAGNOSIS — R309 Painful micturition, unspecified: Secondary | ICD-10-CM

## 2018-05-26 LAB — AMB POC URINALYSIS DIP STICK MANUAL W/O MICRO
Bilirubin (UA POC): NEGATIVE
Bilirubin, Urine, POC: NEGATIVE
Glucose (UA POC): NEGATIVE
Glucose, Urine, POC: NEGATIVE
Leukocyte Esterase, Urine, POC: NEGATIVE
Leukocyte esterase (UA POC): NEGATIVE
Nitrite, Urine, POC: NEGATIVE
Nitrites (UA POC): NEGATIVE
Protein (UA POC): NEGATIVE
Protein, Urine, POC: NEGATIVE
Specific Gravity, Urine, POC: 1.02 NA (ref 1.001–1.035)
Specific gravity (UA POC): 1.02 (ref 1.001–1.035)
Urobilinogen (UA POC): NORMAL (ref 0.2–1)
Urobilinogen, POC: NORMAL (ref 0.2–1)
pH (UA POC): 4.6 (ref 4.6–8.0)
pH, Urine, POC: 4.6 NA (ref 4.6–8.0)

## 2018-05-26 LAB — AMB POC URINE PREGNANCY TEST, VISUAL COLOR COMPARISON
HCG urine, Ql. (POC): NEGATIVE
HCG, Pregnancy, Urine, POC: NEGATIVE

## 2018-05-26 MED ORDER — NITROFURANTOIN (25% MACROCRYSTAL FORM) 100 MG CAP
100 mg | ORAL_CAPSULE | Freq: Two times a day (BID) | ORAL | 0 refills | Status: AC
Start: 2018-05-26 — End: ?

## 2018-05-26 NOTE — Progress Notes (Signed)
Pelvic Pain evaluation    Margaret Brennan is a 33 y.o. female who complains of pelvic pain.   The pain is described as burning and cramping, and is 4/10 in intensity.   Pain is located in the suprapubic without radiation. She took a Plan B around 05/05/2018  Never on the pill til last month--ordered on line from [facility]--started     The pain started a few days ago.   Her symptoms have been unchanged since.   Aggravating factors: movement and activity.    Alleviating factors: none.   Associated symptoms: nausea, urinary frequency, vaginal discharge, heavy vaginal bleeding, irregular taste in foods  The patient denies constipation, diarrhea and fever.  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    Past Medical History:   Diagnosis Date   ??? Anemia    ??? Asthma      History reviewed. No pertinent surgical history.  Social History     Occupational History   ??? Not on file   Tobacco Use   ??? Smoking status: Never Smoker   ??? Smokeless tobacco: Never Used   Substance and Sexual Activity   ??? Alcohol use: Yes   ??? Drug use: Never   ??? Sexual activity: Yes     Partners: Male     Birth control/protection: Pill     History reviewed. No pertinent family history.    No Known Allergies  Prior to Admission medications    Medication Sig Start Date End Date Taking? Authorizing Provider   levonorgestrel-ethinyl estradiol (LEVORA 0.15/30, 28,) 0.15-0.03 mg tab Take  by mouth.   Yes Provider, Historical        Review of Systems: History obtained from the patient  Constitutional: negative for weight loss, fever, night sweats  Breast: negative for breast lumps, nipple discharge, galactorrhea  GI: negative for change in bowel habits, abdominal pain, black or bloody stools  GU: negative for frequency, dysuria, hematuria, vaginal discharge  MSK: negative for back pain, joint pain, muscle pain  Skin: negative for itching, rash, hives  Psych: negative for anxiety, depression, change in mood      Objective:    Visit Vitals   BP 120/80   LMP 04/27/2018       Physical Exam:     Constitutional  ?? Appearance: well-nourished, well developed, alert, in no acute distress    Gastrointestinal  ?? Abdominal Examination: abdomen non-tender to palpation, normal bowel sounds, no masses present  ?? Liver and spleen: no hepatomegaly present, spleen not palpable  ?? Hernias: no hernias identified    Genitourinary  ?? External Genitalia: normal appearance for age, no discharge present, no tenderness present, no inflammatory lesions present, no masses present, no atrophy present  ?? Vagina: normal vaginal vault without central or paravaginal defects, heavy bloody discharge present, no inflammatory lesions present, no masses present  ?? Bladder: non-tender to palpation  ?? Urethra: appears normal  ?? Cervix: normal   ?? Uterus: retroflexed, so unknown size, firm consistency so may have myoma  ?? Adnexa: no adnexal tenderness present, no adnexal masses present  ?? Perineum: perineum within normal limits, no evidence of trauma, no rashes or skin lesions present  ?? Anus: anus within normal limits, no hemorrhoids present  ?? Inguinal Lymph Nodes: no lymphadenopathy present    Skin  ?? General Inspection: no rash, no lesions identified    Neurologic/Psychiatric  ?? Mental Status:  ?? Orientation: grossly oriented to person, place and time  ??  Mood and Affect: mood normal, affect appropriate    Assessment:  Probable UTI from initial symptoms.  Is on internet ordered OCP, may not be taking correctly.  May have had yeast but heavy bleeding has cleared that if so.  Pelvic pain may be retrograde bleeding or UTI.     Plan:   Rx Macrobid.  Suggest she have an [place] at some point, someplace.  Suggested she double up on active pills for a couple days to slow down bleeding.    I spent 30 minutes with the patient, more than half of which was face to face counseling.        RTO prn if symptoms persist or worsen.  Instructions given to pt.  Handouts given to pt.

## 2018-05-26 NOTE — Progress Notes (Signed)
Pelvic Pain evaluation    Margaret Brennan is a 33 y.o. female who complains of pelvic pain.   The pain is described as burning and cramping, and is 4/10 in intensity.   Pain is located in the suprapubic without radiation. She took a Plan B around 05/05/2018  Never on the pill til last month--ordered on line from [facility]--started     The pain started a few days ago.   Her symptoms have been unchanged since.   Aggravating factors: movement and activity.    Alleviating factors: none.   Associated symptoms: nausea, urinary frequency, vaginal discharge, heavy vaginal bleeding, irregular taste in foods  The patient denies constipation, diarrhea and fever.  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    Past Medical History:   Diagnosis Date   ??? Anemia    ??? Asthma      History reviewed. No pertinent surgical history.  Social History     Occupational History   ??? Not on file   Tobacco Use   ??? Smoking status: Never Smoker   ??? Smokeless tobacco: Never Used   Substance and Sexual Activity   ??? Alcohol use: Yes   ??? Drug use: Never   ??? Sexual activity: Yes     Partners: Male     Birth control/protection: Pill     History reviewed. No pertinent family history.    No Known Allergies  Prior to Admission medications    Medication Sig Start Date End Date Taking? Authorizing Provider   levonorgestrel-ethinyl estradiol (LEVORA 0.15/30, 28,) 0.15-0.03 mg tab Take  by mouth.   Yes Provider, Historical        Review of Systems: History obtained from the patient  Constitutional: negative for weight loss, fever, night sweats  Breast: negative for breast lumps, nipple discharge, galactorrhea  GI: negative for change in bowel habits, abdominal pain, black or bloody stools  GU: negative for frequency, dysuria, hematuria, vaginal discharge  MSK: negative for back pain, joint pain, muscle pain  Skin: negative for itching, rash, hives  Psych: negative for anxiety, depression, change in mood      Objective:    Visit Vitals  BP 120/80    LMP 04/27/2018       Physical Exam:     Constitutional  ?? Appearance: well-nourished, well developed, alert, in no acute distress    Gastrointestinal  ?? Abdominal Examination: abdomen non-tender to palpation, normal bowel sounds, no masses present  ?? Liver and spleen: no hepatomegaly present, spleen not palpable  ?? Hernias: no hernias identified    Genitourinary  ?? External Genitalia: normal appearance for age, no discharge present, no tenderness present, no inflammatory lesions present, no masses present, no atrophy present  ?? Vagina: normal vaginal vault without central or paravaginal defects, heavy bloody discharge present, no inflammatory lesions present, no masses present  ?? Bladder: non-tender to palpation  ?? Urethra: appears normal  ?? Cervix: normal   ?? Uterus: retroflexed, so unknown size, firm consistency so may have myoma  ?? Adnexa: no adnexal tenderness present, no adnexal masses present  ?? Perineum: perineum within normal limits, no evidence of trauma, no rashes or skin lesions present  ?? Anus: anus within normal limits, no hemorrhoids present  ?? Inguinal Lymph Nodes: no lymphadenopathy present    Skin  ?? General Inspection: no rash, no lesions identified    Neurologic/Psychiatric  ?? Mental Status:  ?? Orientation: grossly oriented to person, place and time  ??  Mood and Affect: mood normal, affect appropriate    Assessment:  Probable UTI from initial symptoms.  Is on internet ordered OCP, may not be taking correctly.  May have had yeast but heavy bleeding has cleared that if so.  Pelvic pain may be retrograde bleeding or UTI.     Plan:   Rx Macrobid.  Suggest she have an [place] at some point, someplace.  Suggested she double up on active pills for a couple days to slow down bleeding.    I spent 30 minutes with the patient, more than half of which was face to face counseling.        RTO prn if symptoms persist or worsen.  Instructions given to pt.  Handouts given to pt.

## 2018-05-30 LAB — CULTURE, URINE

## 2018-08-13 IMAGING — CR DG CHEST 1V PORT
1 series · 1 of 1 positions shown · non-contrast
Comparison: Chest radiograph performed 04/22/2014

CLINICAL DATA: Acute onset of shortness of breath.

EXAM:
PORTABLE CHEST 1 VIEW

[portable]
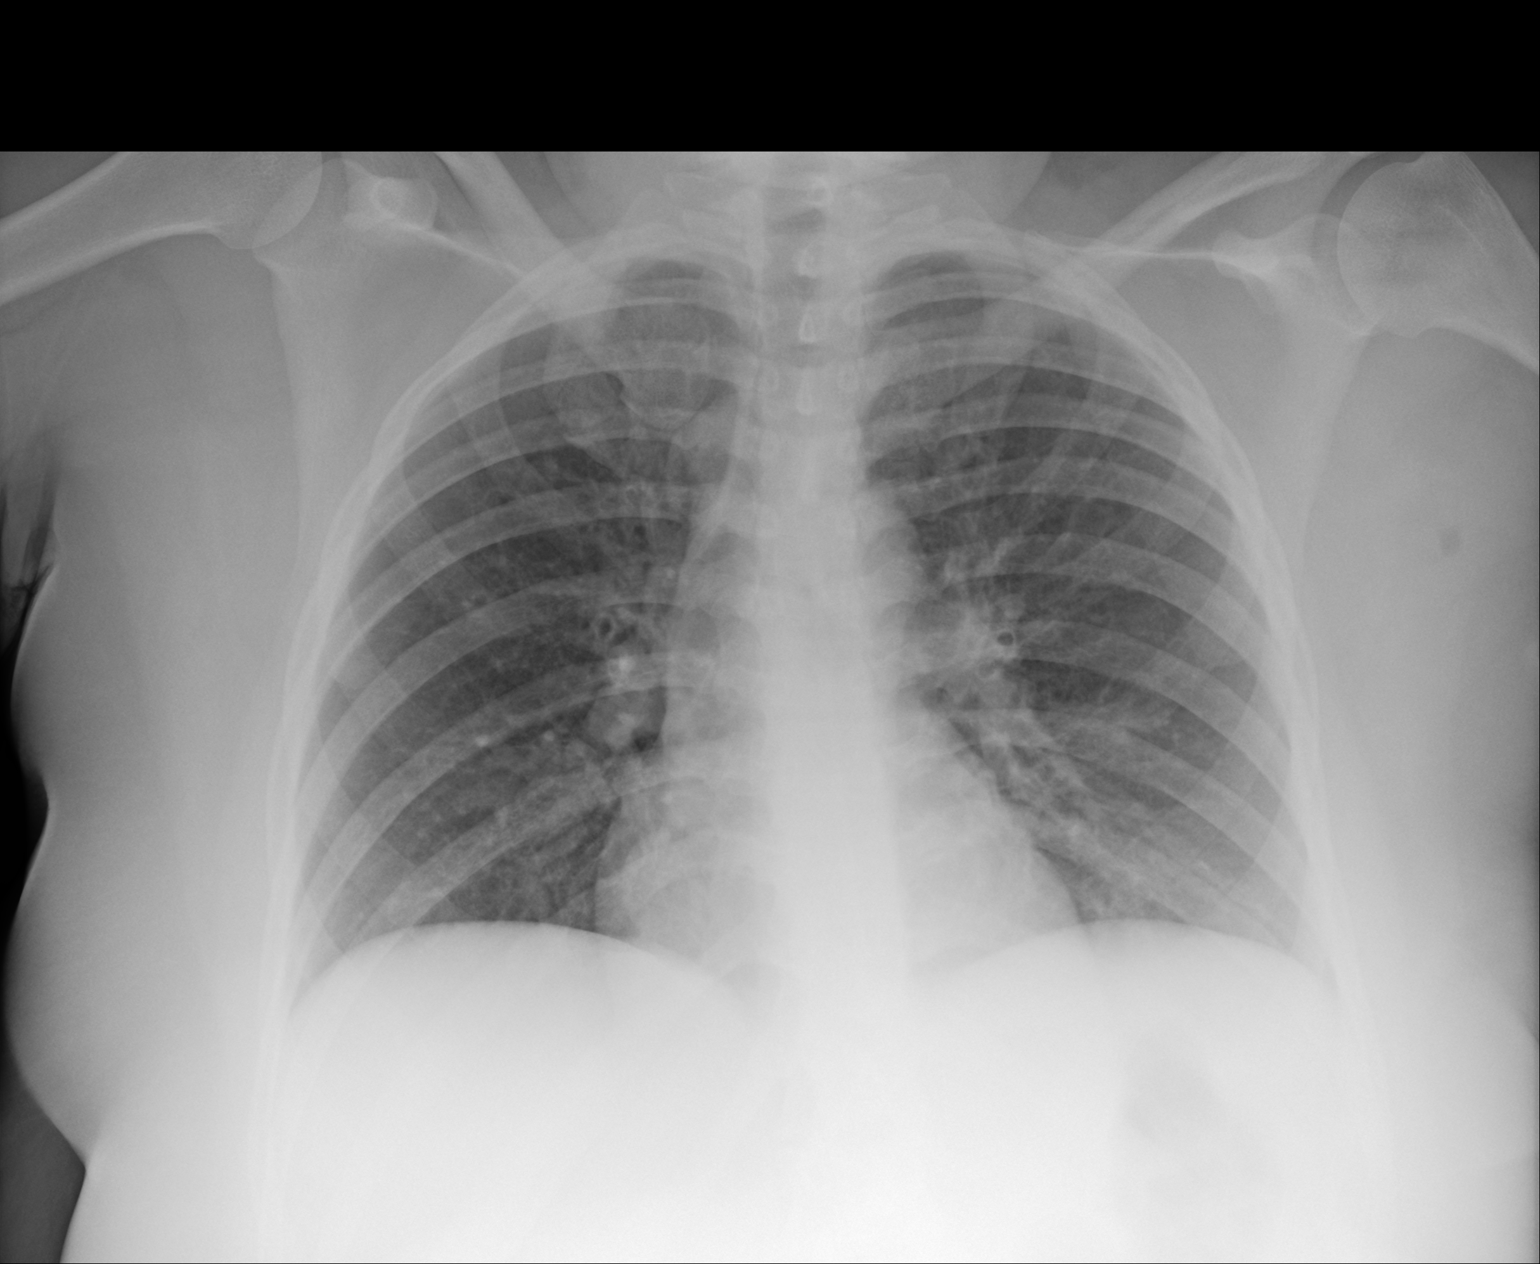

[1 of 1 positions shown; findings below may reference images not displayed]

FINDINGS: The lungs are well-aerated. Peribronchial thickening is noted. Mild
vascular congestion is seen. There is no evidence of focal
opacification, pleural effusion or pneumothorax.

The cardiomediastinal silhouette is within normal limits. No acute
osseous abnormalities are seen.
IMPRESSION: Peribronchial thickening noted. Mild vascular congestion seen. Lungs
otherwise grossly clear.
